# Patient Record
Sex: Female | Born: 2000 | Race: Black or African American | Hispanic: No | Marital: Single | Smoking: Never smoker
Health system: Southern US, Community
[De-identification: ages and names within clinical notes are randomized; demographics above are authoritative.]

## PROBLEM LIST (undated history)

## (undated) DIAGNOSIS — T7840XA Allergy, unspecified, initial encounter: Secondary | ICD-10-CM

## (undated) DIAGNOSIS — J45909 Unspecified asthma, uncomplicated: Secondary | ICD-10-CM

## (undated) DIAGNOSIS — F32A Depression, unspecified: Secondary | ICD-10-CM

## (undated) DIAGNOSIS — F419 Anxiety disorder, unspecified: Secondary | ICD-10-CM

## (undated) DIAGNOSIS — B9689 Other specified bacterial agents as the cause of diseases classified elsewhere: Secondary | ICD-10-CM

## (undated) DIAGNOSIS — N760 Acute vaginitis: Secondary | ICD-10-CM

## (undated) HISTORY — DX: Allergy, unspecified, initial encounter: T78.40XA

## (undated) HISTORY — DX: Other specified bacterial agents as the cause of diseases classified elsewhere: B96.89

## (undated) HISTORY — PX: THROAT SURGERY: SHX803

## (undated) HISTORY — DX: Depression, unspecified: F32.A

## (undated) HISTORY — DX: Other specified bacterial agents as the cause of diseases classified elsewhere: N76.0

## (undated) HISTORY — DX: Anxiety disorder, unspecified: F41.9

---
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
## 2018-07-16 DIAGNOSIS — Z0000 Encounter for general adult medical examination without abnormal findings: Secondary | ICD-10-CM | POA: Insufficient documentation

## 2018-07-16 DIAGNOSIS — R42 Dizziness and giddiness: Secondary | ICD-10-CM | POA: Insufficient documentation

## 2018-12-19 ENCOUNTER — Other Ambulatory Visit: Payer: Self-pay | Admitting: *Deleted

## 2018-12-19 DIAGNOSIS — Z20822 Contact with and (suspected) exposure to covid-19: Secondary | ICD-10-CM

## 2018-12-25 LAB — NOVEL CORONAVIRUS, NAA: SARS-CoV-2, NAA: NOT DETECTED

## 2018-12-31 ENCOUNTER — Telehealth: Payer: Self-pay | Admitting: General Practice

## 2018-12-31 NOTE — Telephone Encounter (Signed)
Informed pt of negative covid result   °

## 2019-07-17 ENCOUNTER — Other Ambulatory Visit: Payer: Self-pay

## 2019-07-17 ENCOUNTER — Emergency Department
Admission: EM | Admit: 2019-07-17 | Discharge: 2019-07-17 | Disposition: A | Discharge status: Home / Self Care | Payer: BC Managed Care – PPO | Attending: Emergency Medicine | Admitting: Emergency Medicine

## 2019-07-17 ENCOUNTER — Emergency Department: Payer: BC Managed Care – PPO

## 2019-07-17 ENCOUNTER — Encounter: Payer: Self-pay | Admitting: Emergency Medicine

## 2019-07-17 DIAGNOSIS — Z79899 Other long term (current) drug therapy: Secondary | ICD-10-CM | POA: Insufficient documentation

## 2019-07-17 DIAGNOSIS — J45909 Unspecified asthma, uncomplicated: Secondary | ICD-10-CM | POA: Insufficient documentation

## 2019-07-17 DIAGNOSIS — M5412 Radiculopathy, cervical region: Secondary | ICD-10-CM

## 2019-07-17 DIAGNOSIS — H6121 Impacted cerumen, right ear: Secondary | ICD-10-CM | POA: Insufficient documentation

## 2019-07-17 MED ORDER — DOCUSATE SODIUM 50 MG/5ML PO LIQD
50.0000 mg | Freq: Once | ORAL | Status: AC
  Administered 2019-07-17: 50 mg via ORAL
  Filled 2019-07-17: qty 10

## 2019-07-17 NOTE — ED Provider Notes (Signed)
[facility] Emergency Department Provider Note  ____________________________________________  Time seen: Approximately 5:33 AM  I have reviewed the triage vital signs and the nursing notes.   HISTORY  Chief Complaint Neck Pain and Otalgia   HPI Sandra Dennis is a 19 y.o. female who presents for evaluation of ear pain and neck pain.  Patient reports that she is here today because she has been dealing with a week of right ear pain.  She has been trying to clean her ear with qtips but that is not happening.  She reports that the pain is constant dull and sharp, moderate.  She is also complaining of neck pain.  She reports that she works in a [employer].  She has had 24 hours of intermittent neck pain that she describes as pain in the center of her neck radiating down to both her arms.  The pain is sharp and intermittent.  Today while at work, she had an episode of the pain that was associated with numbness of the left arm.  She was sent home and therefore decided to come here for evaluation.  She has no pain at this time.  The pain is worse with extension at the shoulder joint.  She denies any trauma.   Past Medical History:  Diagnosis Date  . Asthma     There are no problems to display for this patient.   History reviewed. No pertinent surgical history.  Prior to Admission medications   Medication Sig Start Date End Date Taking? Authorizing Provider  albuterol (PROVENTIL HFA;VENTOLIN HFA) 108 (90 Base) MCG/ACT inhaler Inhale 2 puffs into the lungs every 6 (six) hours as needed for wheezing or shortness of breath. 03/17/17   Caryn Section Linden Dolin, PA-C  azithromycin (ZITHROMAX) 250 MG tablet 2 pills today then 1 pill a day for 4 days 03/17/17   Caryn Section Linden Dolin, PA-C  methylPREDNISolone (MEDROL DOSEPAK) 4 MG TBPK tablet Take 6 pills on day one then decrease by 1 pill each day 03/17/17   Versie Starks, PA-C    Allergies Pollen extract  No  family history on file.  Social History Social History   Tobacco Use  . Smoking status: Never Smoker  . Smokeless tobacco: Never Used  Substance Use Topics  . Alcohol use: No  . Drug use: Not on file    Review of Systems  Constitutional: Negative for fever. Eyes: Negative for visual changes. ENT: Negative for sore throat. + R ear pain Neck: + neck pain  Cardiovascular: Negative for chest pain. Respiratory: Negative for shortness of breath. Gastrointestinal: Negative for abdominal pain, vomiting or diarrhea. Genitourinary: Negative for dysuria. Musculoskeletal: Negative for back pain. Skin: Negative for rash. Neurological: Negative for headaches, weakness or numbness. Psych: No SI or HI  ____________________________________________   PHYSICAL EXAM:  VITAL SIGNS: ED Triage Vitals  Enc Vitals Group     BP 07/17/19 0352 122/71     Pulse Rate 07/17/19 0352 70     Resp 07/17/19 0352 18     Temp 07/17/19 0352 97.7 F (36.5 C)     Temp Source 07/17/19 0352 Oral     SpO2 07/17/19 0352 100 %     Weight 07/17/19 0346 150 lb (68 kg)     Height 07/17/19 0346 5\' 8"  (1.727 m)     Head Circumference --      Peak Flow --      Pain Score 07/17/19 0346 7  Pain Loc --      Pain Edu? --      Excl. in GC? --     Constitutional: Alert and oriented. Well appearing and in no apparent distress. HEENT:      Head: Normocephalic and atraumatic.         Eyes: Conjunctivae are normal. Sclera is non-icteric.       Ears: Cerumen impaction on the R ear      Mouth/Throat: Mucous membranes are moist.       Neck: Supple with no signs of meningismus. No midline cspine tenderness Cardiovascular: Regular rate and rhythm. Respiratory: Normal respiratory effort.  Musculoskeletal: Full painless ROM of the neck. Extension of the shoulder joint bilaterally elicits the pain. Normal distal pulses and brisk capillary refill.  Neurologic: Intact strength and sensation of b/l arms. Skin: Skin is  warm, dry and intact. No rash noted. Psychiatric: Mood and affect are normal. Speech and behavior are normal.  ____________________________________________   LABS (all labs ordered are listed, but only abnormal results are displayed)  Labs Reviewed - No data to display ____________________________________________  EKG  none  ____________________________________________  RADIOLOGY  I have personally reviewed the images performed during this visit and I agree with the Radiologist's read.   Interpretation by Radiologist:  DG Cervical Spine Complete  Result Date: 07/17/2019 CLINICAL DATA:  Neck pain. Right ear pain radiating into the neck since yesterday EXAM: CERVICAL SPINE - COMPLETE 4+ VIEW COMPARISON:  None. FINDINGS: There is no evidence of cervical spine fracture or prevertebral soft tissue swelling. Alignment is normal. No other significant bone abnormalities are identified. IMPRESSION: Negative cervical spine radiographs. Electronically Signed   By: Marnee Spring M.D.   On: 07/17/2019 05:02     ____________________________________________   PROCEDURES  Procedure(s) performed:yes .Ear Cerumen Removal  Date/Time: 07/17/2019 5:33 AM Performed by: Nita Sickle, MD Authorized by: Nita Sickle, MD   Consent:    Consent obtained:  Verbal   Consent given by:  Patient   Risks discussed:  Bleeding, infection, pain, TM perforation, incomplete removal and dizziness   Alternatives discussed:  Alternative treatment Procedure details:    Location:  R ear   Procedure type: irrigation   Post-procedure details:    Inspection:  TM intact   Hearing quality:  Improved   Patient tolerance of procedure:  Tolerated well, no immediate complications   Critical Care performed:  None ____________________________________________   INITIAL IMPRESSION / ASSESSMENT AND PLAN / ED COURSE   19 y.o. female who presents for evaluation of ear pain and neck pain.   # ear pain:  Cerumen impaction removed per procedure note above.  Patient tolerated well with no complications.  TM visualized and intact after procedure.   # neck pain: Seems radicular in nature vs MSK from work line. Neuro intact, vascular intact, no cspine tenderness. XR with no compression fracture or other abnormalities. Recommended tylenol or ibuprofen and avoiding heavy things for a couple of days. If symptoms persist or new symptoms arise, follow up with PCP or return to the ER for further evaluation.        As part of my medical decision making, I reviewed the following data within the electronic MEDICAL RECORD NUMBER Nursing notes reviewed and incorporated, Old chart reviewed, Radiograph reviewed , Notes from prior ED visits and  Controlled Substance Database   Please note:  Patient was evaluated in Emergency Department today for the symptoms described in the history of present illness. Patient was evaluated in  the context of the global COVID-19 pandemic, which necessitated consideration that the patient might be at risk for infection with the SARS-CoV-2 virus that causes COVID-19. Institutional protocols and algorithms that pertain to the evaluation of patients at risk for COVID-19 are in a state of rapid change based on information released by regulatory bodies including the CDC and federal and state organizations. These policies and algorithms were followed during the patient's care in the ED.  Some ED evaluations and interventions may be delayed as a result of limited staffing during the pandemic.   ____________________________________________   FINAL CLINICAL IMPRESSION(S) / ED DIAGNOSES   Final diagnoses:  Cervical radiculopathy  Impacted cerumen of right ear      NEW MEDICATIONS STARTED DURING THIS VISIT:  ED Discharge Orders    None       Note:  This document was prepared using Dragon voice recognition software and may include unintentional dictation errors.    Don Perking,  [place], MD 07/17/19 [phone]

## 2019-07-17 NOTE — ED Triage Notes (Signed)
Patient ambulatory to triage with steady gait, without difficulty or distress noted, mask in place; pt reports rt ear pain radiating into neck since yesterday; denies any recent illness

## 2020-01-20 ENCOUNTER — Ambulatory Visit
Admission: RE | Admit: 2020-01-20 | Discharge: 2020-01-20 | Disposition: A | Discharge status: Home / Self Care | Payer: 59 | Source: Ambulatory Visit | Attending: Emergency Medicine | Admitting: Emergency Medicine

## 2020-01-20 ENCOUNTER — Other Ambulatory Visit: Payer: Self-pay

## 2020-01-20 VITALS — BP 100/66 | HR 117 | Temp 99.7°F | Resp 16

## 2020-01-20 DIAGNOSIS — J029 Acute pharyngitis, unspecified: Secondary | ICD-10-CM | POA: Diagnosis present

## 2020-01-20 LAB — POCT RAPID STREP A (OFFICE): Rapid Strep A Screen: NEGATIVE

## 2020-01-20 NOTE — ED Provider Notes (Signed)
Sandra Dennis    CSN: [id number] Arrival date & time: 01/20/20  0850      History   Chief Complaint Chief Complaint  Patient presents with   Sore Throat    HPI Sandra Dennis is a 19 y.o. female.   Patient presents with 1 day history of sore throat.  She also reports chills and mild facial swelling.  She denies fever, earache, cough, shortness of breath, abdominal pain, vomiting, diarrhea, rash, or other symptoms.  Treatment attempted at home with salt water gargles.  The history is provided by the patient.    Past Medical History:  Diagnosis Date   Asthma     There are no problems to display for this patient.   History reviewed. No pertinent surgical history.  OB History   No obstetric history on file.      Home Medications    Prior to Admission medications   Medication Sig Start Date End Date Taking? Authorizing Provider  albuterol (PROVENTIL HFA;VENTOLIN HFA) 108 (90 Base) MCG/ACT inhaler Inhale 2 puffs into the lungs every 6 (six) hours as needed for wheezing or shortness of breath. 03/17/17   Sherrie Mustache Roselyn Bering, PA-C  azithromycin (ZITHROMAX) 250 MG tablet 2 pills today then 1 pill a day for 4 days 03/17/17   Sherrie Mustache Roselyn Bering, PA-C  methylPREDNISolone (MEDROL DOSEPAK) 4 MG TBPK tablet Take 6 pills on day one then decrease by 1 pill each day 03/17/17   Faythe Ghee, PA-C    Family History History reviewed. No pertinent family history.  Social History Social History   Tobacco Use   Smoking status: Never Smoker   Smokeless tobacco: Never Used  Building services engineer Use: Never used  Substance Use Topics   Alcohol use: No   Drug use: Not on file     Allergies   Pollen extract   Review of Systems Review of Systems  Constitutional: Positive for chills. Negative for fever.  HENT: Positive for facial swelling and sore throat. Negative for ear pain.   Eyes: Negative for pain and visual disturbance.  Respiratory: Negative for cough and  shortness of breath.   Cardiovascular: Negative for chest pain and palpitations.  Gastrointestinal: Negative for abdominal pain, diarrhea, nausea and vomiting.  Genitourinary: Negative for dysuria and hematuria.  Musculoskeletal: Negative for arthralgias and back pain.  Skin: Negative for color change and rash.  Neurological: Negative for seizures and syncope.  All other systems reviewed and are negative.    Physical Exam Triage Vital Signs ED Triage Vitals  Enc Vitals Group     BP 01/20/20 0858 100/66     Pulse Rate 01/20/20 0858 (!) 117     Resp 01/20/20 0858 16     Temp 01/20/20 0858 99.7 F (37.6 C)     Temp Source 01/20/20 0858 Oral     SpO2 01/20/20 0858 96 %     Weight --      Height --      Head Circumference --      Peak Flow --      Pain Score 01/20/20 0900 0     Pain Loc --      Pain Edu? --      Excl. in GC? --    No data found.  Updated Vital Signs BP 100/66 (BP Location: Left Arm)    Pulse (!) 117    Temp 99.7 F (37.6 C) (Oral)    Resp 16    LMP  01/12/2020 (Approximate)    SpO2 96%   Visual Acuity Right Eye Distance:   Left Eye Distance:   Bilateral Distance:    Right Eye Near:   Left Eye Near:    Bilateral Near:     Physical Exam Vitals and nursing note reviewed.  Constitutional:      General: She is not in acute distress.    Appearance: She is well-developed. She is not ill-appearing.  HENT:     Head: Normocephalic and atraumatic.     Right Ear: Tympanic membrane normal.     Left Ear: Tympanic membrane normal.     Nose: Nose normal.     Mouth/Throat:     Mouth: Mucous membranes are moist.     Pharynx: Uvula midline. Posterior oropharyngeal erythema present. No uvula swelling.     Tonsils: 0 on the right. 0 on the left.     Comments: No facial swelling noted.  Eyes:     Conjunctiva/sclera: Conjunctivae normal.  Cardiovascular:     Rate and Rhythm: Normal rate and regular rhythm.     Heart sounds: No murmur heard.   Pulmonary:      Effort: Pulmonary effort is normal. No respiratory distress.     Breath sounds: Normal breath sounds.  Abdominal:     Palpations: Abdomen is soft.     Tenderness: There is no abdominal tenderness. There is no guarding or rebound.  Musculoskeletal:     Cervical back: Neck supple.  Skin:    General: Skin is warm and dry.     Findings: No rash.  Neurological:     General: No focal deficit present.     Mental Status: She is alert and oriented to person, place, and time.     Gait: Gait normal.  Psychiatric:        Mood and Affect: Mood normal.        Behavior: Behavior normal.      UC Treatments / Results  Labs (all labs ordered are listed, but only abnormal results are displayed) Labs Reviewed  CULTURE, GROUP A STREP ([facility])  NOVEL CORONAVIRUS, NAA  POCT RAPID STREP A (OFFICE)    EKG   Radiology No results found.  Procedures Procedures (including critical care time)  Medications Ordered in UC Medications - No data to display  Initial Impression / Assessment and Plan / UC Course  I have reviewed the triage vital signs and the nursing notes.  Pertinent labs & imaging results that were available during my care of the patient were reviewed by me and considered in my medical decision making (see chart for details).   Sore throat.  Rapid strep negative; throat culture pending.  COVID test performed here.  Instructed patient to self quarantine until the test result is back.  Discussed with patient that she can take Tylenol as needed for fever or discomfort.  Instructed patient to go to the emergency department if she develops high fever, shortness of breath, severe diarrhea, or other concerning symptoms.  Patient agrees with plan of care.    Final Clinical Impressions(s) / UC Diagnoses   Final diagnoses:  Sore throat     Discharge Instructions     Your rapid strep test is negative.  A throat culture is pending; we will call you if it is positive requiring treatment.     Your COVID test is pending.  You should self quarantine until the test result is back.    Take Tylenol as needed for fever or  discomfort.  Rest and keep yourself hydrated.    Go to the emergency department if you develop acute worsening symptoms.        ED Prescriptions    None     PDMP not reviewed this encounter.   Mickie Bail, NP 01/20/20 [phone]

## 2020-01-20 NOTE — ED Triage Notes (Signed)
Pt presents to UC for left sided sore throat and facial swelling x2 days. Pt denies cough, congestion, fever, chills, body aches, HA.   Pt denies OTC treatments or relieving factors.

## 2020-01-20 NOTE — Discharge Instructions (Addendum)
Your rapid strep test is negative.  A throat culture is pending; we will call you if it is positive requiring treatment.    Your COVID test is pending.  You should self quarantine until the test result is back.    Take Tylenol as needed for fever or discomfort.  Rest and keep yourself hydrated.    Go to the emergency department if you develop acute worsening symptoms.     

## 2020-01-22 LAB — NOVEL CORONAVIRUS, NAA: SARS-CoV-2, NAA: NOT DETECTED

## 2020-01-22 LAB — SARS-COV-2, NAA 2 DAY TAT

## 2020-01-24 ENCOUNTER — Observation Stay
Admission: EM | Admit: 2020-01-24 | Discharge: 2020-01-25 | Disposition: A | Discharge status: Home / Self Care | Payer: 59 | Attending: Otolaryngology | Admitting: Otolaryngology

## 2020-01-24 ENCOUNTER — Encounter: Payer: Self-pay | Admitting: Emergency Medicine

## 2020-01-24 ENCOUNTER — Encounter
Admission: EM | Disposition: A | Discharge status: Home / Self Care | Payer: Self-pay | Source: Home / Self Care | Attending: Student in an Organized Health Care Education/Training Program

## 2020-01-24 ENCOUNTER — Emergency Department: Payer: 59

## 2020-01-24 ENCOUNTER — Other Ambulatory Visit: Payer: Self-pay

## 2020-01-24 ENCOUNTER — Emergency Department: Payer: 59 | Admitting: Certified Registered"

## 2020-01-24 DIAGNOSIS — R000 Tachycardia, unspecified: Secondary | ICD-10-CM | POA: Diagnosis not present

## 2020-01-24 DIAGNOSIS — J45909 Unspecified asthma, uncomplicated: Secondary | ICD-10-CM | POA: Insufficient documentation

## 2020-01-24 DIAGNOSIS — J36 Peritonsillar abscess: Principal | ICD-10-CM | POA: Diagnosis present

## 2020-01-24 DIAGNOSIS — R590 Localized enlarged lymph nodes: Secondary | ICD-10-CM | POA: Diagnosis not present

## 2020-01-24 DIAGNOSIS — Z20822 Contact with and (suspected) exposure to covid-19: Secondary | ICD-10-CM | POA: Insufficient documentation

## 2020-01-24 DIAGNOSIS — J029 Acute pharyngitis, unspecified: Secondary | ICD-10-CM | POA: Diagnosis present

## 2020-01-24 HISTORY — PX: INCISION AND DRAINAGE OF PERITONSILLAR ABCESS: SHX6257

## 2020-01-24 LAB — URINALYSIS, COMPLETE (UACMP) WITH MICROSCOPIC
Bacteria, UA: NONE SEEN
Bilirubin Urine: NEGATIVE
Glucose, UA: NEGATIVE mg/dL
Hgb urine dipstick: NEGATIVE
Ketones, ur: 80 mg/dL — AB
Leukocytes,Ua: NEGATIVE
Nitrite: NEGATIVE
Protein, ur: 30 mg/dL — AB
Specific Gravity, Urine: 1.029 (ref 1.005–1.030)
pH: 5 (ref 5.0–8.0)

## 2020-01-24 LAB — CULTURE, GROUP A STREP (THRC)

## 2020-01-24 LAB — COMPREHENSIVE METABOLIC PANEL
ALT: 13 U/L (ref 0–44)
AST: 15 U/L (ref 15–41)
Albumin: 4.2 g/dL (ref 3.5–5.0)
Alkaline Phosphatase: 60 U/L (ref 38–126)
Anion gap: 14 (ref 5–15)
BUN: 15 mg/dL (ref 6–20)
CO2: 23 mmol/L (ref 22–32)
Calcium: 9.6 mg/dL (ref 8.9–10.3)
Chloride: 99 mmol/L (ref 98–111)
Creatinine, Ser: 0.84 mg/dL (ref 0.44–1.00)
GFR calc Af Amer: >60 mL/min (ref >60)
GFR calc non Af Amer: >60 mL/min (ref >60)
Glucose, Bld: 103 mg/dL — ABNORMAL HIGH (ref 70–99)
Potassium: 3.8 mmol/L (ref 3.5–5.1)
Sodium: 136 mmol/L (ref 135–145)
Total Bilirubin: 2.2 mg/dL — ABNORMAL HIGH (ref 0.3–1.2)
Total Protein: 8.7 g/dL — ABNORMAL HIGH (ref 6.5–8.1)

## 2020-01-24 LAB — CBC WITH DIFFERENTIAL/PLATELET
Abs Immature Granulocytes: 0.05 10*3/uL (ref 0.00–0.07)
Basophils Absolute: 0 10*3/uL (ref 0.0–0.1)
Basophils Relative: 0 %
Eosinophils Absolute: 0.1 10*3/uL (ref 0.0–0.5)
Eosinophils Relative: 0 %
HCT: 39.9 % (ref 36.0–46.0)
Hemoglobin: 13.8 g/dL (ref 12.0–15.0)
Immature Granulocytes: 0 %
Lymphocytes Relative: 9 %
Lymphs Abs: 1.3 10*3/uL (ref 0.7–4.0)
MCH: 29.7 pg (ref 26.0–34.0)
MCHC: 34.6 g/dL (ref 30.0–36.0)
MCV: 86 fL (ref 80.0–100.0)
Monocytes Absolute: 1.5 10*3/uL — ABNORMAL HIGH (ref 0.1–1.0)
Monocytes Relative: 11 %
Neutro Abs: 11.2 10*3/uL — ABNORMAL HIGH (ref 1.7–7.7)
Neutrophils Relative %: 80 %
Platelets: 277 10*3/uL (ref 150–400)
RBC: 4.64 MIL/uL (ref 3.87–5.11)
RDW: 11.5 % (ref 11.5–15.5)
WBC: 14.2 10*3/uL — ABNORMAL HIGH (ref 4.0–10.5)
nRBC: 0 % (ref 0.0–0.2)

## 2020-01-24 LAB — POCT PREGNANCY, URINE: Preg Test, Ur: NEGATIVE

## 2020-01-24 LAB — MONONUCLEOSIS SCREEN: Mono Screen: NEGATIVE

## 2020-01-24 LAB — LACTIC ACID, PLASMA: Lactic Acid, Venous: 1.3 mmol/L (ref 0.5–1.9)

## 2020-01-24 LAB — POC SARS CORONAVIRUS 2 AG: SARS Coronavirus 2 Ag: NEGATIVE

## 2020-01-24 SURGERY — INCISION AND DRAINAGE, ABSCESS, PERITONSILLAR
Anesthesia: General

## 2020-01-24 MED ORDER — KETOROLAC TROMETHAMINE 30 MG/ML IJ SOLN
INTRAMUSCULAR | Status: DC | PRN
  Administered 2020-01-24: 30 mg via INTRAVENOUS

## 2020-01-24 MED ORDER — DEXAMETHASONE SODIUM PHOSPHATE 10 MG/ML IJ SOLN
10.0000 mg | Freq: Once | INTRAMUSCULAR | Status: AC
  Administered 2020-01-24: 10 mg via INTRAVENOUS
  Filled 2020-01-24: qty 1

## 2020-01-24 MED ORDER — SILVER NITRATE-POT NITRATE 75-25 % EX MISC
CUTANEOUS | Status: AC
  Filled 2020-01-24: qty 10

## 2020-01-24 MED ORDER — ONDANSETRON HCL 4 MG/2ML IJ SOLN: 4.0000 mg | Freq: Once | INTRAMUSCULAR | Status: DC | PRN

## 2020-01-24 MED ORDER — ONDANSETRON HCL 4 MG/2ML IJ SOLN
INTRAMUSCULAR | Status: AC
  Filled 2020-01-24: qty 2

## 2020-01-24 MED ORDER — ACETAMINOPHEN 10 MG/ML IV SOLN
INTRAVENOUS | Status: DC | PRN
  Administered 2020-01-24: 1000 mg via INTRAVENOUS

## 2020-01-24 MED ORDER — MIDAZOLAM HCL 2 MG/2ML IJ SOLN
INTRAMUSCULAR | Status: DC | PRN
  Administered 2020-01-24: 2 mg via INTRAVENOUS

## 2020-01-24 MED ORDER — PROPOFOL 10 MG/ML IV BOLUS
INTRAVENOUS | Status: DC | PRN
  Administered 2020-01-24: 140 mg via INTRAVENOUS

## 2020-01-24 MED ORDER — ONDANSETRON HCL 4 MG/2ML IJ SOLN
INTRAMUSCULAR | Status: DC | PRN
  Administered 2020-01-24: 4 mg via INTRAVENOUS

## 2020-01-24 MED ORDER — ONDANSETRON HCL 4 MG/2ML IJ SOLN
4.0000 mg | Freq: Once | INTRAMUSCULAR | Status: AC
  Administered 2020-01-24: 4 mg via INTRAVENOUS
  Filled 2020-01-24: qty 2

## 2020-01-24 MED ORDER — HYDROCODONE-ACETAMINOPHEN 5-325 MG PO TABS
1.0000 | ORAL_TABLET | ORAL | Status: DC | PRN
  Administered 2020-01-25: 1 via ORAL
  Filled 2020-01-24: qty 1

## 2020-01-24 MED ORDER — ACETAMINOPHEN 10 MG/ML IV SOLN
INTRAVENOUS | Status: AC
  Filled 2020-01-24: qty 100

## 2020-01-24 MED ORDER — FENTANYL CITRATE (PF) 100 MCG/2ML IJ SOLN
INTRAMUSCULAR | Status: AC
  Filled 2020-01-24: qty 2

## 2020-01-24 MED ORDER — SODIUM CHLORIDE 0.9 % IV SOLN
3.0000 g | Freq: Once | INTRAVENOUS | Status: AC
  Administered 2020-01-24: 3 g via INTRAVENOUS
  Filled 2020-01-24: qty 8

## 2020-01-24 MED ORDER — PROPOFOL 10 MG/ML IV BOLUS
INTRAVENOUS | Status: AC
  Filled 2020-01-24: qty 20

## 2020-01-24 MED ORDER — ONDANSETRON HCL 4 MG/2ML IJ SOLN: 4.0000 mg | Freq: Four times a day (QID) | INTRAMUSCULAR | Status: DC | PRN

## 2020-01-24 MED ORDER — FENTANYL CITRATE (PF) 100 MCG/2ML IJ SOLN: 25.0000 ug | INTRAMUSCULAR | Status: DC | PRN

## 2020-01-24 MED ORDER — MORPHINE SULFATE (PF) 2 MG/ML IV SOLN
2.0000 mg | Freq: Once | INTRAVENOUS | Status: AC
  Administered 2020-01-24: 2 mg via INTRAVENOUS
  Filled 2020-01-24: qty 1

## 2020-01-24 MED ORDER — MIDAZOLAM HCL 2 MG/2ML IJ SOLN
INTRAMUSCULAR | Status: AC
  Filled 2020-01-24: qty 2

## 2020-01-24 MED ORDER — ACETAMINOPHEN 500 MG PO TABS
1000.0000 mg | ORAL_TABLET | Freq: Once | ORAL | Status: AC
  Administered 2020-01-24: 1000 mg via ORAL
  Filled 2020-01-24: qty 2

## 2020-01-24 MED ORDER — KETOROLAC TROMETHAMINE 30 MG/ML IJ SOLN
INTRAMUSCULAR | Status: AC
  Filled 2020-01-24: qty 1

## 2020-01-24 MED ORDER — LACTATED RINGERS IV SOLN: INTRAVENOUS | Status: DC | PRN

## 2020-01-24 MED ORDER — MORPHINE SULFATE (PF) 4 MG/ML IV SOLN: 3.0000 mg | INTRAVENOUS | Status: DC | PRN

## 2020-01-24 MED ORDER — SUCCINYLCHOLINE CHLORIDE 20 MG/ML IJ SOLN
INTRAMUSCULAR | Status: DC | PRN
  Administered 2020-01-24 (×2): 100 mg via INTRAVENOUS

## 2020-01-24 MED ORDER — IOHEXOL 300 MG/ML  SOLN
75.0000 mL | Freq: Once | INTRAMUSCULAR | Status: AC | PRN
  Administered 2020-01-24: 75 mL via INTRAVENOUS
  Filled 2020-01-24: qty 75

## 2020-01-24 MED ORDER — MAGNESIUM HYDROXIDE 400 MG/5ML PO SUSP: 30.0000 mL | Freq: Every day | ORAL | Status: DC | PRN

## 2020-01-24 MED ORDER — SUGAMMADEX SODIUM 200 MG/2ML IV SOLN
INTRAVENOUS | Status: DC | PRN
Start: 2020-01-24 — End: 2020-01-24
  Administered 2020-01-24: 100 mg via INTRAVENOUS

## 2020-01-24 MED ORDER — ONDANSETRON 4 MG PO TBDP: 4.0000 mg | ORAL_TABLET | Freq: Four times a day (QID) | ORAL | Status: DC | PRN

## 2020-01-24 MED ORDER — ROCURONIUM BROMIDE 100 MG/10ML IV SOLN
INTRAVENOUS | Status: DC | PRN
  Administered 2020-01-24: 10 mg via INTRAVENOUS

## 2020-01-24 MED ORDER — SODIUM CHLORIDE 0.9 % IV BOLUS
1000.0000 mL | Freq: Once | INTRAVENOUS | Status: AC
  Administered 2020-01-24: 1000 mL via INTRAVENOUS

## 2020-01-24 MED ORDER — DEXAMETHASONE SODIUM PHOSPHATE 10 MG/ML IJ SOLN: 10.0000 mg | Freq: Once | INTRAMUSCULAR | Status: DC

## 2020-01-24 MED ORDER — LIDOCAINE HCL (PF) 2 % IJ SOLN
INTRAMUSCULAR | Status: AC
  Filled 2020-01-24: qty 5

## 2020-01-24 MED ORDER — SODIUM CHLORIDE 0.9 % IV SOLN
3.0000 g | Freq: Four times a day (QID) | INTRAVENOUS | Status: DC
  Administered 2020-01-24 – 2020-01-25 (×2): 3 g via INTRAVENOUS
  Filled 2020-01-24: qty 3
  Filled 2020-01-24 (×3): qty 8
  Filled 2020-01-24: qty 3

## 2020-01-24 MED ORDER — ALBUTEROL SULFATE (2.5 MG/3ML) 0.083% IN NEBU: 2.5000 mg | INHALATION_SOLUTION | Freq: Four times a day (QID) | RESPIRATORY_TRACT | Status: DC | PRN

## 2020-01-24 MED ORDER — LIDOCAINE HCL (CARDIAC) PF 100 MG/5ML IV SOSY
PREFILLED_SYRINGE | INTRAVENOUS | Status: DC | PRN
  Administered 2020-01-24: 100 mg via INTRAVENOUS

## 2020-01-24 MED ORDER — FENTANYL CITRATE (PF) 100 MCG/2ML IJ SOLN
INTRAMUSCULAR | Status: DC | PRN
  Administered 2020-01-24 (×2): 50 ug via INTRAVENOUS

## 2020-01-24 SURGICAL SUPPLY — 9 items
CANISTER SUCT 1200ML W/VALVE (MISCELLANEOUS) ×3 IMPLANT
COAG SUCT 10F 3.5MM HAND CTRL (MISCELLANEOUS) ×3 IMPLANT
COVER WAND RF STERILE (DRAPES) ×3 IMPLANT
ELECT REM PT RETURN 9FT ADLT (ELECTROSURGICAL) ×3
ELECTRODE REM PT RTRN 9FT ADLT (ELECTROSURGICAL) ×1 IMPLANT
GLOVE PROTEXIS LATEX SZ 7.5 (GLOVE) ×3 IMPLANT
GOWN STRL REUS W/ TWL LRG LVL3 (GOWN DISPOSABLE) ×1 IMPLANT
GOWN STRL REUS W/TWL LRG LVL3 (GOWN DISPOSABLE) ×2
PACK HEAD/NECK (MISCELLANEOUS) ×3 IMPLANT

## 2020-01-24 NOTE — ED Notes (Signed)
Pt transported to OR via stretcher by ED tech

## 2020-01-24 NOTE — Progress Notes (Signed)
Patient COVID negative.

## 2020-01-24 NOTE — Op Note (Signed)
01/24/2020  8:42 PM    Sandra Dennis  [id number]   Pre-Op Dx: Left peritonsillar abscess  Post-op Dx: Same  Proc: I&D left peritonsillar abscess  Surg:  Sandra Dennis Sandra Dennis  Anes:  GOT  EBL: 20 mL  Comp: None  Findings: Tense left peritonsillar area with large abscess.  Culture taken.  Purulence was draining through the tonsil prior to draining   Procedure: The patient was brought to the operating room and placed in the supine position.  She was given general anesthesia by oral endotracheal intubation.  There is no swelling at her epiglottis or larynx.  The left tonsil was pushed towards the midline with purulence coming from a crease in the middle of the tonsil.  There was pointing along the anterior tonsillar pillar.  The right tonsil was not very swollen and the posterior pharyngeal wall was smooth and not swollen either.  A Vernelle Emerald was used to hold the mouth open to visualize this area.  The nasopharynx was clear.  The tonsil was as noted above.  An incision was created in the left anterior tonsillar pillar where it was pointing.  Incision was approximately 15 mm long.  Pus immediately came rolling out and this was cultured and sent for aerobic and anaerobic bacteria.  A hemostat was used to widen this area some and suction was used to completely clear the left peritonsillar area.  There was mild oozing but no significant bleeding from the abscess area.  This was flushed with saline to make sure it was all cleared out.  The patient tolerated the procedure well.  She was awakened taken to the recovery room in satisfactory condition.  The wound was left open to heal by secondary intention.  Dispo:   To PACU to be then sent to the floor for observation overnight.  Plan: We will continue IV antibiotics through the night and give a dose of IV Decadron in the morning.  If she is tolerating liquids well and can swallow pills then she can be discharged home in the morning.  If she  still is not tolerating orals well then she will need to stay possibly another 24 hours to make sure that she will not get dehydrated at home and that she can take her medications at home.  Sandra Dennis Sandra Dennis  01/24/2020 8:42 PM

## 2020-01-24 NOTE — Anesthesia Preprocedure Evaluation (Signed)
Anesthesia Evaluation  Patient identified by MRN, date of birth, ID band Patient awake    Reviewed: Allergy & Precautions, H&P , NPO status , Patient's Chart, lab work & pertinent test results, reviewed documented beta blocker date and time   Airway Mallampati: II  TM Distance: >3 FB Neck ROM: full    Dental  (+) Teeth Intact   Pulmonary asthma ,    Pulmonary exam normal        Cardiovascular Exercise Tolerance: Good negative cardio ROS Normal cardiovascular exam Rhythm:regular Rate:Normal     Neuro/Psych negative neurological ROS  negative psych ROS   GI/Hepatic negative GI ROS, Neg liver ROS,   Endo/Other  negative endocrine ROS  Renal/GU negative Renal ROS  negative genitourinary   Musculoskeletal   Abdominal   Peds  Hematology negative hematology ROS (+)   Anesthesia Other Findings Past Medical History: No date: Asthma History reviewed. No pertinent surgical history. BMI    Body Mass Index: 18.25 kg/m     Reproductive/Obstetrics negative OB ROS                             Anesthesia Physical Anesthesia Plan  ASA: II  Anesthesia Plan: General ETT   Post-op Pain Management:    Induction:   PONV Risk Score and Plan:   Airway Management Planned:   Additional Equipment:   Intra-op Plan:   Post-operative Plan:   Informed Consent: I have reviewed the patients History and Physical, chart, labs and discussed the procedure including the risks, benefits and alternatives for the proposed anesthesia with the patient or authorized representative who has indicated his/her understanding and acceptance.     Dental Advisory Given  Plan Discussed with: CRNA  Anesthesia Plan Comments:         Anesthesia Quick Evaluation

## 2020-01-24 NOTE — Transfer of Care (Signed)
Immediate Anesthesia Transfer of Care Note  Patient: Sandra Dennis  Procedure(s) Performed: INCISION AND DRAINAGE OF PERITONSILLAR ABCESS (N/A )  Patient Location: PACU  Anesthesia Type:General  Level of Consciousness: awake, alert  and oriented  Airway & Oxygen Therapy: Patient Spontanous Breathing and Patient connected to nasal cannula oxygen  Post-op Assessment: Report given to RN and Post -op Vital signs reviewed and stable  Post vital signs: Reviewed and stable  Last Vitals:  Vitals Value Taken Time  BP 119/82 01/24/20 2048  Temp 36.1 C 01/24/20 2048  Pulse 94 01/24/20 2050  Resp 22 01/24/20 2050  SpO2 100 % 01/24/20 2050  Vitals shown include unvalidated device data.  Last Pain:  Vitals:   01/24/20 2048  TempSrc:   PainSc: 0-No pain         Complications: No complications documented.

## 2020-01-24 NOTE — ED Notes (Signed)
Patient transported to CT 

## 2020-01-24 NOTE — ED Provider Notes (Signed)
[facility] EMERGENCY DEPARTMENT Provider Note   CSN: [id number] Arrival date & time: 01/24/20  1233     History Chief Complaint  Patient presents with  . Sore Throat    Sandra Dennis is a 19 y.o. female presents to the emergency department for evaluation of sore throat x6 days.  Patient seen at urgent care facility on 01/20/2020, had negative Covid and strep test as well as a negative strep culture.  Patient has been taking Tylenol with little relief.  She has had a low-grade fever and describes severe left-sided sore throat pain with left mandibular pain radiating into her ear.  She denies any rashes.  She describes painful trismus.  No headaches, abdominal pain, nausea or vomiting.  No coughing chest pain or shortness of breath.  Pain is moderate.  She is been able to tolerate some fluids and swallowing pills but has not been eating much due to severity of left-sided throat pain.  She does not feel as if her throat is swollen.  HPI     Past Medical History:  Diagnosis Date  . Asthma     There are no problems to display for this patient.   History reviewed. No pertinent surgical history.   OB History   No obstetric history on file.     History reviewed. No pertinent family history.  Social History   Tobacco Use  . Smoking status: Never Smoker  . Smokeless tobacco: Never Used  Vaping Use  . Vaping Use: Never used  Substance Use Topics  . Alcohol use: No  . Drug use: Not on file    Home Medications Prior to Admission medications   Medication Sig Start Date End Date Taking? Authorizing Provider  albuterol (PROVENTIL HFA;VENTOLIN HFA) 108 (90 Base) MCG/ACT inhaler Inhale 2 puffs into the lungs every 6 (six) hours as needed for wheezing or shortness of breath. 03/17/17   Sherrie Mustache Roselyn Bering, PA-C  azithromycin (ZITHROMAX) 250 MG tablet 2 pills today then 1 pill a day for 4 days 03/17/17   Sherrie Mustache Roselyn Bering, PA-C  methylPREDNISolone (MEDROL DOSEPAK) 4  MG TBPK tablet Take 6 pills on day one then decrease by 1 pill each day 03/17/17   Faythe Ghee, PA-C    Allergies    Pollen extract  Review of Systems   Review of Systems  Constitutional: Positive for fever. Negative for chills.  HENT: Positive for sore throat, trouble swallowing and voice change. Negative for congestion, ear discharge, facial swelling, rhinorrhea, sinus pressure and sinus pain.   Respiratory: Negative for cough, choking, chest tightness, shortness of breath, wheezing and stridor.   Cardiovascular: Negative for chest pain.  Gastrointestinal: Negative for abdominal pain, diarrhea, nausea and vomiting.  Genitourinary: Negative for dysuria, flank pain and pelvic pain.  Musculoskeletal: Negative for back pain, myalgias and neck stiffness.  Skin: Negative for rash.  Neurological: Negative for dizziness, weakness and headaches.    Physical Exam Updated Vital Signs BP 115/77 (BP Location: Right Arm)   Pulse 99   Temp (!) 100.4 F (38 C) (Oral)   Resp 16   Ht 5\' 8"  (1.727 m)   Wt 54.4 kg   LMP 01/12/2020 (Approximate) Comment: neg preg test today  SpO2 97%   BMI 18.25 kg/m   Physical Exam Vitals reviewed.  Constitutional:      General: She is not in acute distress.    Appearance: Normal appearance. She is well-developed and normal weight.  HENT:  Head: Normocephalic and atraumatic.     Jaw: No trismus.     Comments: Left submandibular region with mild soft tissue swelling and tenderness to palpation.  Slight anterior and posterior cervical lymphadenopathy present more on the left side than right.  Trismus is noted, unable to visualize pharynx completely, does appear to be some left-sided peritonsillar soft tissue swelling but unable to exhibit any exudative tonsillitis.  Due to pain and trismus pharyngeal exam limited.  No noticeable anterior neck swelling on exam.  No noted facial swelling on exam.    Right Ear: Hearing, tympanic membrane, ear canal and  external ear normal.     Left Ear: Hearing, tympanic membrane, ear canal and external ear normal.     Nose: No rhinorrhea.     Mouth/Throat:     Pharynx: Posterior oropharyngeal erythema present. No oropharyngeal exudate or uvula swelling.     Tonsils: No tonsillar exudate or tonsillar abscesses.  Eyes:     General:        Right eye: No discharge.        Left eye: No discharge.     Conjunctiva/sclera: Conjunctivae normal.  Cardiovascular:     Rate and Rhythm: Regular rhythm. Tachycardia present.     Pulses: Normal pulses.     Heart sounds: Normal heart sounds.  Pulmonary:     Effort: Pulmonary effort is normal. No respiratory distress.     Breath sounds: Normal breath sounds. No stridor. No wheezing or rales.  Abdominal:     General: Abdomen is flat. There is no distension.     Palpations: Abdomen is soft.     Tenderness: There is no abdominal tenderness.  Musculoskeletal:        General: No deformity. Normal range of motion.     Cervical back: Normal range of motion and neck supple.  Lymphadenopathy:     Cervical: Cervical adenopathy present.  Skin:    General: Skin is warm and dry.  Neurological:     General: No focal deficit present.     Mental Status: She is alert and oriented to person, place, and time. Mental status is at baseline.     Deep Tendon Reflexes: Reflexes are normal and symmetric.  Psychiatric:        Behavior: Behavior normal.        Thought Content: Thought content normal.     ED Results / Procedures / Treatments   Labs (all labs ordered are listed, but only abnormal results are displayed) Labs Reviewed  COMPREHENSIVE METABOLIC PANEL - Abnormal; Notable for the following components:      Result Value   Glucose, Bld 103 (*)    Total Protein 8.7 (*)    Total Bilirubin 2.2 (*)    All other components within normal limits  CBC WITH DIFFERENTIAL/PLATELET - Abnormal; Notable for the following components:   WBC 14.2 (*)    Neutro Abs 11.2 (*)     Monocytes Absolute 1.5 (*)    All other components within normal limits  URINALYSIS, COMPLETE (UACMP) WITH MICROSCOPIC - Abnormal; Notable for the following components:   Color, Urine AMBER (*)    APPearance HAZY (*)    Ketones, ur 80 (*)    Protein, ur 30 (*)    All other components within normal limits  CULTURE, BLOOD (SINGLE)  LACTIC ACID, PLASMA  MONONUCLEOSIS SCREEN  POC URINE PREG, ED  POCT PREGNANCY, URINE  POC URINE PREG, ED  POC SARS CORONAVIRUS 2 AG -  ED  EKG None  Radiology CT Soft Tissue Neck W Contrast  Result Date: 01/24/2020 CLINICAL DATA:  Neck swelling EXAM: CT NECK WITH CONTRAST TECHNIQUE: Multidetector CT imaging of the neck was performed using the standard protocol following the bolus administration of intravenous contrast. CONTRAST:  75mL OMNIPAQUE IOHEXOL 300 MG/ML  SOLN COMPARISON:  None. FINDINGS: Pharynx and larynx: Within the dorsal left nasopharyngeal wall there is a 1.0 x 0.8 cm peripherally enhancing fluid collection. Anteromedial displacement of the left adenoid and tubal tonsil. The fluid collection tracks inferiorly along the left parapharyngeal space with anterior displacement of the left palatine tonsil. Inferiorly the peripherally enhancing collection measures up to 2.7 x 1.4 cm (3:37) at the level of the lingual tonsil which is medially displaced. The collection measures 4.6 cm in cranial caudal dimension (6:49). Inflammatory stranding within the left parapharyngeal fat. Mild narrowing of the pharyngeal airway. Partial effacement of the left vallecula and piriform sinus. Normal appearance of the hypopharynx. The glottic and subglottic larynx are unremarkable. Salivary glands: No inflammation, mass, or stone. Thyroid: Normal. Lymph nodes: Enlarged left level 1B, bilateral 2A and 2B nodes are reactive. Prominent nodes within the bilateral level 4 and left level 5 regions are also reactive. Vascular: Normal intravascular enhancement. Limited intracranial:  Grossly unremarkable. Visualized orbits: Normal orbits. Mastoids and visualized paranasal sinuses: Mild ethmoid sinus mucosal thickening. Clear mastoid air cells. Skeleton: No acute or suspicious osseous abnormalities. Upper chest: Minimal atelectasis. Other: None. IMPRESSION: Left peritonsillar abscess extending from the level of the nasopharynx to the level of the mandibular angle measuring up to 4.6 x 2.7 x 1.4 cm. Mild narrowing of the pharyngeal airway. Reactive bilateral cervical adenopathy. These results were called by telephone at the time of interpretation on 01/24/2020 at 6:28 pm to provider PA Greig RightSusan Fisher, who verbally acknowledged these results. Electronically Signed   By: Stana Buntinghikanele  Emekauwa M.D.   On: 01/24/2020 18:30    Procedures Procedures (including critical care time)  Medications Ordered in ED Medications  sodium chloride 0.9 % bolus 1,000 mL (1,000 mLs Intravenous New Bag/Given 01/24/20 1643)  Ampicillin-Sulbactam (UNASYN) 3 g in sodium chloride 0.9 % 100 mL IVPB (0 g Intravenous Stopped 01/24/20 1912)  morphine 2 MG/ML injection 2 mg (2 mg Intravenous Given 01/24/20 1647)  dexamethasone (DECADRON) injection 10 mg (10 mg Intravenous Given 01/24/20 1646)  ondansetron (ZOFRAN) injection 4 mg (4 mg Intravenous Given 01/24/20 1645)  acetaminophen (TYLENOL) tablet 1,000 mg (1,000 mg Oral Given 01/24/20 1649)  iohexol (OMNIPAQUE) 300 MG/ML solution 75 mL (75 mLs Intravenous Contrast Given 01/24/20 1730)    ED Course  I have reviewed the triage vital signs and the nursing notes.  Pertinent labs & imaging results that were available during my care of the patient were reviewed by me and considered in my medical decision making (see chart for details).    MDM Rules/Calculators/A&P                          19 year old female with large left peritonsillar abscess.  Symptoms began 6 days ago, 5 days ago seen by urgent care, had negative strep and Covid test.  Patient was told to take  over-the-counter medications.  Today, she comes in for worsening symptoms.  She has significant trismus with voice changes.  She was handling her secretions.  Patient with low-grade fever, normal heart rate and blood pressure.  Mild leukocytosis of 14.2.  CT scan obtained showing large left peritonsillar abscess.  Discussed  with ENT who will come in and evaluate patient in the ER and will likely take to the OR for incision and drainage.  Rapid Covid test ordered.  Discussed exam and imaging findings as well as plan with patient     Final Clinical Impression(s) / ED Diagnoses Final diagnoses:  Pharyngitis, unspecified etiology    Rx / DC Orders ED Discharge Orders    None       Ronnette Juniper 01/24/20 1926    Willy Eddy, MD 01/24/20 1935

## 2020-01-24 NOTE — H&P (Signed)
Sandra Dennis is an 19 y.o. female.   Chief Complaint: Acute left throat pain HPI: The patient is a 19 year old African-American female who is only had 1 previous sore throat in her life.  She started having signs of throat infection a week or so ago.  She was given Zithromax, Medrol Dosepak, and albuterol inhaler.  Her sore throat has worsened and now in the last 2 days she is only been able to drink a very little bit of fluids.  She has not had any solid food in a couple of days.  She is barely can get her mouth open at all.  She has never had an infection like this before.  Past Medical History:  Diagnosis Date  . Asthma     History reviewed. No pertinent surgical history.  History reviewed. No pertinent family history. Social History:  reports that she has never smoked. She has never used smokeless tobacco. She reports that she does not drink alcohol. No history on file for drug use.  Allergies:  Allergies  Allergen Reactions  . Pollen Extract Swelling    Itchy eyes, runny nose Itchy eyes, runny nose     (Not in a hospital admission)   Results for orders placed or performed during the hospital encounter of 01/24/20 (from the past 48 hour(s))  Lactic acid, plasma     Status: None   Collection Time: 01/24/20 12:53 PM  Result Value Ref Range   Lactic Acid, Venous 1.3 0.5 - 1.9 mmol/L    Comment: Performed at [facility], [address] [place], [place] [address]  Comprehensive metabolic panel     Status: Abnormal   Collection Time: 01/24/20 12:53 PM  Result Value Ref Range   Sodium 136 135 - 145 mmol/L   Potassium 3.8 3.5 - 5.1 mmol/L   Chloride 99 98 - 111 mmol/L   CO2 23 22 - 32 mmol/L   Glucose, Bld 103 (H) 70 - 99 mg/dL    Comment: Glucose reference range applies only to samples taken after fasting for at least 8 hours.   BUN 15 6 - 20 mg/dL   Creatinine, Ser 9.56 0.44 - 1.00 mg/dL   Calcium 9.6 8.9 - 38.7 mg/dL   Total Protein 8.7 (H) 6.5 - 8.1 g/dL    Albumin 4.2 3.5 - 5.0 g/dL   AST 15 15 - 41 U/L   ALT 13 0 - 44 U/L   Alkaline Phosphatase 60 38 - 126 U/L   Total Bilirubin 2.2 (H) 0.3 - 1.2 mg/dL   GFR calc non Af Amer >60 >60 mL/min   GFR calc Af Amer >60 >60 mL/min   Anion gap 14 5 - 15    Comment: Performed at [facility], [address] [place], [place] [address]  CBC with Differential     Status: Abnormal   Collection Time: 01/24/20 12:53 PM  Result Value Ref Range   WBC 14.2 (H) 4.0 - 10.5 K/uL   RBC 4.64 3.87 - 5.11 MIL/uL   Hemoglobin 13.8 12.0 - 15.0 g/dL   HCT 29.5 36 - 46 %   MCV 86.0 80.0 - 100.0 fL   MCH 29.7 26.0 - 34.0 pg   MCHC 34.6 30.0 - 36.0 g/dL   RDW 18.8 41.6 - 60.6 %   Platelets 277 150 - 400 K/uL   nRBC 0.0 0.0 - 0.2 %   Neutrophils Relative % 80 %   Neutro Abs 11.2 (H) 1.7 - 7.7 K/uL  Lymphocytes Relative 9 %   Lymphs Abs 1.3 0.7 - 4.0 K/uL   Monocytes Relative 11 %   Monocytes Absolute 1.5 (H) 0 - 1 K/uL   Eosinophils Relative 0 %   Eosinophils Absolute 0.1 0 - 0 K/uL   Basophils Relative 0 %   Basophils Absolute 0.0 0 - 0 K/uL   Immature Granulocytes 0 %   Abs Immature Granulocytes 0.05 0.00 - 0.07 K/uL    Comment: Performed at [facility], [address] [place], [place] [address]  Urinalysis, Complete w Microscopic Urine, Clean Catch     Status: Abnormal   Collection Time: 01/24/20 12:53 PM  Result Value Ref Range   Color, Urine AMBER (A) YELLOW    Comment: BIOCHEMICALS MAY BE AFFECTED BY COLOR   APPearance HAZY (A) CLEAR   Specific Gravity, Urine 1.029 1.005 - 1.030   pH 5.0 5.0 - 8.0   Glucose, UA NEGATIVE NEGATIVE mg/dL   Hgb urine dipstick NEGATIVE NEGATIVE   Bilirubin Urine NEGATIVE NEGATIVE   Ketones, ur 80 (A) NEGATIVE mg/dL   Protein, ur 30 (A) NEGATIVE mg/dL   Nitrite NEGATIVE NEGATIVE   Leukocytes,Ua NEGATIVE NEGATIVE   RBC / HPF 0-5 0 - 5 RBC/hpf   WBC, UA 0-5 0 - 5 WBC/hpf   Bacteria, UA NONE SEEN NONE SEEN   Squamous Epithelial / LPF 6-10 0 - 5    Mucus PRESENT     Comment: Performed at [facility], [address] [place], [place] [address]  Mononucleosis screen     Status: None   Collection Time: 01/24/20 12:53 PM  Result Value Ref Range   Mono Screen NEGATIVE NEGATIVE    Comment: Performed at [facility], [address] [place], [place] [address]  Pregnancy, urine POC     Status: None   Collection Time: 01/24/20  1:01 PM  Result Value Ref Range   Preg Test, Ur NEGATIVE NEGATIVE    Comment:        THE SENSITIVITY OF THIS METHODOLOGY IS >24 mIU/mL    CT Soft Tissue Neck W Contrast  Result Date: 01/24/2020 CLINICAL DATA:  Neck swelling EXAM: CT NECK WITH CONTRAST TECHNIQUE: Multidetector CT imaging of the neck was performed using the standard protocol following the bolus administration of intravenous contrast. CONTRAST:  84mL OMNIPAQUE IOHEXOL 300 MG/ML  SOLN COMPARISON:  None. FINDINGS: Pharynx and larynx: Within the dorsal left nasopharyngeal wall there is a 1.0 x 0.8 cm peripherally enhancing fluid collection. Anteromedial displacement of the left adenoid and tubal tonsil. The fluid collection tracks inferiorly along the left parapharyngeal space with anterior displacement of the left palatine tonsil. Inferiorly the peripherally enhancing collection measures up to 2.7 x 1.4 cm (3:37) at the level of the lingual tonsil which is medially displaced. The collection measures 4.6 cm in cranial caudal dimension (6:49). Inflammatory stranding within the left parapharyngeal fat. Mild narrowing of the pharyngeal airway. Partial effacement of the left vallecula and piriform sinus. Normal appearance of the hypopharynx. The glottic and subglottic larynx are unremarkable. Salivary glands: No inflammation, mass, or stone. Thyroid: Normal. Lymph nodes: Enlarged left level 1B, bilateral 2A and 2B nodes are reactive. Prominent nodes within the bilateral level 4 and left level 5 regions are also reactive. Vascular: Normal  intravascular enhancement. Limited intracranial: Grossly unremarkable. Visualized orbits: Normal orbits. Mastoids and visualized paranasal sinuses: Mild ethmoid sinus mucosal thickening. Clear mastoid air cells. Skeleton: No acute or suspicious osseous abnormalities. Upper chest: Minimal atelectasis. Other:  None. IMPRESSION: Left peritonsillar abscess extending from the level of the nasopharynx to the level of the mandibular angle measuring up to 4.6 x 2.7 x 1.4 cm. Mild narrowing of the pharyngeal airway. Reactive bilateral cervical adenopathy. These results were called by telephone at the time of interpretation on 01/24/2020 at 6:28 pm to provider PA Greig Right, who verbally acknowledged these results. Electronically Signed   By: Stana Bunting M.D.   On: 01/24/2020 18:30    Review of Systems  Blood pressure 115/77, pulse 99, temperature (!) 100.4 F (38 C), temperature source Oral, resp. rate 16, height 5\' 8"  (1.727 m), weight 54.4 kg, last menstrual period 01/12/2020, SpO2 97 %. Physical Exam : The patient is awake and alert and very cooperative.  Her nose is open and clear.  Her oropharynx shows severe trismus and she cannot get her mouth open more than about 1 cm.  She has some swelling the left anterior tonsillar pillar and the tonsils pushed forward and medially with some exudate.  I cannot see the posterior pharyngeal wall well.  The mouth has a very strong odor.  Her neck is thin and easily palpable.  There is some tenderness and swelling under the left angle of the jaw but no other specific nodes are palpable.  She has no thyromegaly.  Her lungs are clear to auscultation.  Her heart shows regular rate and rhythm without murmur.  Her abdomen is benign.  Her extremities are without deformity or any swelling.  I reviewed her CT scan which shows evidence of an abscess in the left peritonsillar area that extends from the palate and nasopharyngeal area down to the hypopharynx.  This is over 4 cm  in length and about 2-1/2 cm in width.  Assessment/Plan She has a large left peritonsillar abscess with severe trismus and we are unable to get her mouth open well enough to consider aspirating.  She will need to go to the operating room for general anesthesia so that we can get her mouth open and control her airway while the left peritonsillar abscess is drained.  I explained this to her in detail and will plan to keep her in the hospital overnight on IV antibiotics and hopefully sent home in the morning if she is eating and drinking well and can take oral antibiotics.  She understands the surgery and the potential risks and has no further questions.  Informed surgical request is signed.  03/13/2020, MD 01/24/2020, 7:28 PM

## 2020-01-24 NOTE — ED Notes (Signed)
Pt's clothes in plastic bag, pt only wearing hospital gown.

## 2020-01-24 NOTE — Anesthesia Procedure Notes (Signed)
Procedure Name: Intubation Date/Time: 01/24/2020 8:19 PM Performed by: Estanislado Emms, CRNA Pre-anesthesia Checklist: Patient identified, Patient being monitored, Timeout performed, Emergency Drugs available and Suction available Patient Re-evaluated:Patient Re-evaluated prior to induction Oxygen Delivery Method: Circle system utilized Preoxygenation: Pre-oxygenation with 100% oxygen Induction Type: IV induction Ventilation: Mask ventilation without difficulty Laryngoscope Size: McGraph and 3 Grade View: Grade I Tube type: Oral Tube size: 6.0 mm Number of attempts: 1 Airway Equipment and Method: Stylet Placement Confirmation: ETT inserted through vocal cords under direct vision,  positive ETCO2 and breath sounds checked- equal and bilateral Secured at: 20 cm Tube secured with: Tape Dental Injury: Teeth and Oropharynx as per pre-operative assessment

## 2020-01-24 NOTE — ED Triage Notes (Signed)
Pt arrived via POV with c/o sore throat and mouth pain, x 6 days, pt seen at [facility] on 8/10 had neg strep and neg covid, states the pain is worse, unable to open mouth due to pain and pain with swallowing.  Pt states the pain is worse on the L side, with facial swelling.

## 2020-01-25 ENCOUNTER — Encounter: Payer: Self-pay | Admitting: Otolaryngology

## 2020-01-25 DIAGNOSIS — J36 Peritonsillar abscess: Secondary | ICD-10-CM | POA: Diagnosis not present

## 2020-01-25 NOTE — Discharge Summary (Signed)
Physician Discharge Summary  Patient ID: Sandra Dennis MRN: [id number] DOB/AGE: April 16, 2001 19 y.o.  Admit date: 01/24/2020 Discharge date: 01/25/2020  Admission Diagnoses: Left peritonsillar abscess  Discharge Diagnoses:  Active Problems:   Peritonsillar abscess   Discharged Condition: good  Hospital Course: The patient was admitted last night and an I&D of the left peritonsillar abscess was performed in the operating room under general anesthesia.  She is feeling much better this morning and is swallowing liquids well.  She will be discharged home using Augmentin pills a 75 twice a day for the next 9 days.  I have given her a Medrol Dosepak and she will also have some Tylenol with hydrocodone for pain.  She has not had enough tonsil problems that she would require tonsillectomy right now, but if she continues to have tonsil problems then her tonsils should be removed in the future.  Consults: None  Significant Diagnostic Studies: microbiology: wound culture: Pending  Treatments: surgery: I&D left peritonsillar abscess  Discharge Exam: Blood pressure 116/72, pulse 66, temperature 98 F (36.7 C), temperature source Oral, resp. rate 18, height 5\' 8"  (1.727 m), weight 54.4 kg, last menstrual period 01/12/2020, SpO2 100 %. Her oropharynx shows the incision to be closing down well.  There is no significant swelling there.  She is able to swallow well.  Her neck is not swollen at all.  She is feeling much better.  Disposition: She is to be discharged home   Allergies as of 01/25/2020      Reactions   Pollen Extract Swelling   Itchy eyes, runny nose Itchy eyes, runny nose      Medication List    TAKE these medications   albuterol 108 (90 Base) MCG/ACT inhaler Commonly known as: VENTOLIN HFA Inhale 2 puffs into the lungs every 6 (six) hours as needed for wheezing or shortness of breath.   etonogestrel 68 MG Impl implant Commonly known as: NEXPLANON 68 mg by Subdermal route  once.        Signed8/17/2021 01/25/2020, 8:49 AM

## 2020-01-25 NOTE — Plan of Care (Signed)
Patient received discharge teaching and is in stable condition.

## 2020-01-28 ENCOUNTER — Ambulatory Visit: Payer: Self-pay | Admitting: Adult Health

## 2020-01-28 LAB — AEROBIC/ANAEROBIC CULTURE W GRAM STAIN (SURGICAL/DEEP WOUND)

## 2020-01-29 LAB — CULTURE, BLOOD (SINGLE)
Culture: NO GROWTH
Special Requests: ADEQUATE

## 2020-01-29 NOTE — Anesthesia Postprocedure Evaluation (Signed)
Anesthesia Post Note  Patient: STARSHA MORNING  Procedure(s) Performed: INCISION AND DRAINAGE OF PERITONSILLAR ABCESS (N/A )  Patient location during evaluation: PACU Anesthesia Type: General Level of consciousness: awake and alert Pain management: pain level controlled Vital Signs Assessment: post-procedure vital signs reviewed and stable Respiratory status: spontaneous breathing, nonlabored ventilation, respiratory function stable and patient connected to nasal cannula oxygen Cardiovascular status: blood pressure returned to baseline and stable Postop Assessment: no apparent nausea or vomiting Anesthetic complications: no   No complications documented.   Last Vitals:  Vitals:   01/24/20 2250 01/25/20 0428  BP: 119/64 116/72  Pulse: 66 66  Resp: 18 18  Temp: 36.7 C 36.7 C  SpO2: 99% 100%    Last Pain:  Vitals:   01/25/20 1000  TempSrc:   PainSc: 2                  Yevette Edwards

## 2020-02-12 ENCOUNTER — Ambulatory Visit: Payer: Self-pay | Admitting: Adult Health

## 2020-02-24 ENCOUNTER — Telehealth: Payer: Self-pay | Admitting: Obstetrics and Gynecology

## 2020-02-24 NOTE — Telephone Encounter (Signed)
Patient is schedule for nexplanon removal and replacement along with Annual exam for 03/16/20 with SDJ. Patient will be by to side release of information form to transfer care for [facility]

## 2020-02-25 NOTE — Telephone Encounter (Signed)
Noted. Will order to arrive by apt date/time. 

## 2020-02-26 ENCOUNTER — Ambulatory Visit: Payer: 59 | Admitting: Adult Health

## 2020-03-02 ENCOUNTER — Ambulatory Visit: Payer: Self-pay

## 2020-03-16 ENCOUNTER — Other Ambulatory Visit (HOSPITAL_COMMUNITY)
Admission: RE | Admit: 2020-03-16 | Discharge: 2020-03-16 | Disposition: A | Discharge status: Home / Self Care | Payer: 59 | Source: Ambulatory Visit | Attending: Obstetrics and Gynecology | Admitting: Obstetrics and Gynecology

## 2020-03-16 ENCOUNTER — Ambulatory Visit (INDEPENDENT_AMBULATORY_CARE_PROVIDER_SITE_OTHER): Payer: 59 | Admitting: Obstetrics and Gynecology

## 2020-03-16 ENCOUNTER — Encounter: Payer: Self-pay | Admitting: Obstetrics and Gynecology

## 2020-03-16 ENCOUNTER — Other Ambulatory Visit: Payer: Self-pay

## 2020-03-16 VITALS — BP 122/74 | Ht 68.0 in | Wt 127.0 lb

## 2020-03-16 DIAGNOSIS — Z01419 Encounter for gynecological examination (general) (routine) without abnormal findings: Secondary | ICD-10-CM | POA: Diagnosis not present

## 2020-03-16 DIAGNOSIS — Z124 Encounter for screening for malignant neoplasm of cervix: Secondary | ICD-10-CM | POA: Insufficient documentation

## 2020-03-16 DIAGNOSIS — Z3046 Encounter for surveillance of implantable subdermal contraceptive: Secondary | ICD-10-CM | POA: Diagnosis not present

## 2020-03-16 DIAGNOSIS — Z113 Encounter for screening for infections with a predominantly sexual mode of transmission: Secondary | ICD-10-CM | POA: Insufficient documentation

## 2020-03-16 DIAGNOSIS — Z30011 Encounter for initial prescription of contraceptive pills: Secondary | ICD-10-CM

## 2020-03-16 DIAGNOSIS — Z1339 Encounter for screening examination for other mental health and behavioral disorders: Secondary | ICD-10-CM | POA: Diagnosis not present

## 2020-03-16 DIAGNOSIS — Z1331 Encounter for screening for depression: Secondary | ICD-10-CM

## 2020-03-16 MED ORDER — NORETHIN ACE-ETH ESTRAD-FE 1-20 MG-MCG PO TABS: 1.0000 | ORAL_TABLET | Freq: Every day | ORAL | 4 refills | Status: DC
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
## 2020-03-18 LAB — CYTOLOGY - PAP
Chlamydia: NEGATIVE
Comment: NEGATIVE
Comment: NEGATIVE
Comment: NORMAL
Diagnosis: NEGATIVE
Neisseria Gonorrhea: NEGATIVE
Trichomonas: NEGATIVE

## 2020-06-18 ENCOUNTER — Ambulatory Visit: Payer: Self-pay

## 2020-10-04 ENCOUNTER — Other Ambulatory Visit: Payer: Self-pay

## 2020-10-04 ENCOUNTER — Emergency Department: Payer: 59

## 2020-10-04 ENCOUNTER — Emergency Department
Admission: EM | Admit: 2020-10-04 | Discharge: 2020-10-04 | Disposition: A | Discharge status: Home / Self Care | Payer: 59 | Attending: Emergency Medicine | Admitting: Emergency Medicine

## 2020-10-04 DIAGNOSIS — W503XXA Accidental bite by another person, initial encounter: Secondary | ICD-10-CM

## 2020-10-04 DIAGNOSIS — S60222A Contusion of left hand, initial encounter: Secondary | ICD-10-CM | POA: Insufficient documentation

## 2020-10-04 DIAGNOSIS — S6992XA Unspecified injury of left wrist, hand and finger(s), initial encounter: Secondary | ICD-10-CM | POA: Diagnosis present

## 2020-10-04 DIAGNOSIS — Z23 Encounter for immunization: Secondary | ICD-10-CM | POA: Insufficient documentation

## 2020-10-04 DIAGNOSIS — J45909 Unspecified asthma, uncomplicated: Secondary | ICD-10-CM | POA: Insufficient documentation

## 2020-10-04 MED ORDER — AMOXICILLIN-POT CLAVULANATE 875-125 MG PO TABS: 1.0000 | ORAL_TABLET | Freq: Two times a day (BID) | ORAL | 0 refills | Status: AC

## 2020-10-04 MED ORDER — TETANUS-DIPHTH-ACELL PERTUSSIS 5-2.5-18.5 LF-MCG/0.5 IM SUSY
0.5000 mL | PREFILLED_SYRINGE | Freq: Once | INTRAMUSCULAR | Status: AC
  Administered 2020-10-04: 0.5 mL via INTRAMUSCULAR
  Filled 2020-10-04: qty 0.5
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
## 2020-10-04 NOTE — Discharge Instructions (Addendum)
Take the antibiotic as prescribed.  Wash the area with soap and water Return emergency department worsening Follow-up with orthopedics if not improving in 1 week
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
## 2020-11-10 ENCOUNTER — Ambulatory Visit: Payer: Self-pay

## 2021-02-22 IMAGING — CT CT NECK W/ CM
3 of 4 series · 12 of 33 positions shown, 14 images · IV contrast (omnipaque)
Comparison: None.

CLINICAL DATA: Neck swelling

EXAM:
CT NECK WITH CONTRAST
TECHNIQUE: Multidetector CT imaging of the neck was performed using the
standard protocol following the bolus administration of intravenous
contrast.
CONTRAST:  75mL OMNIPAQUE IOHEXOL 300 MG/ML  SOLN

[Series 6: sag neck · sagittal · 0.43mm/px · 5 of 83 slices shown, 6 images]
[im 28/83  bone]
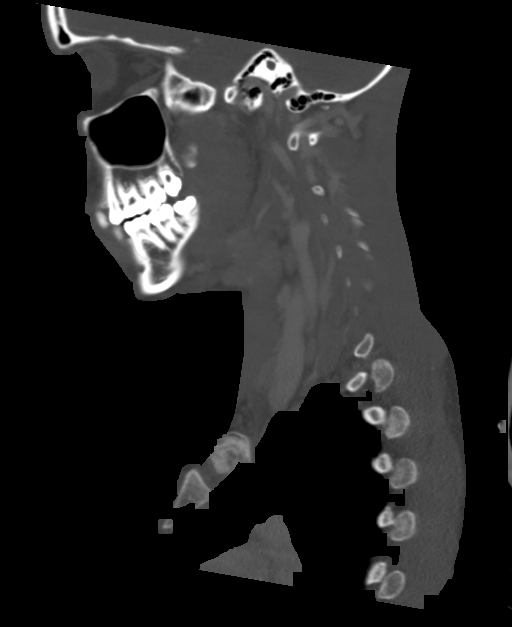
[im 35/83  bone]
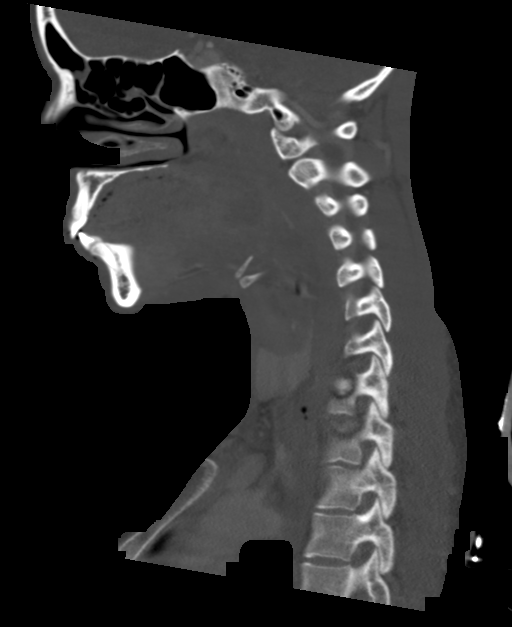
[im 42/83  soft-tissue]
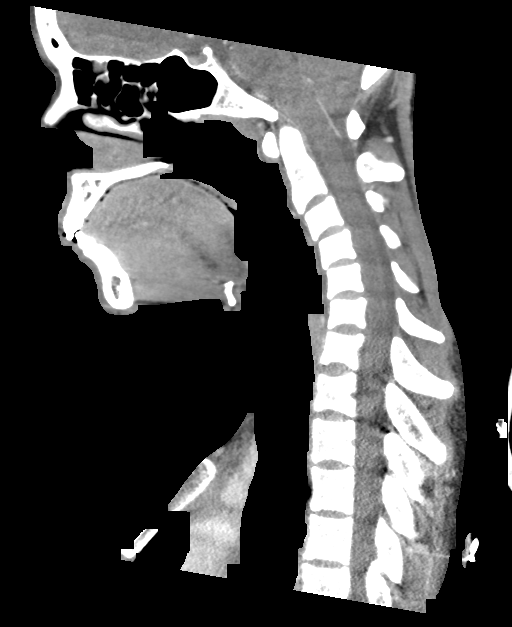
[im 42/83  bone]
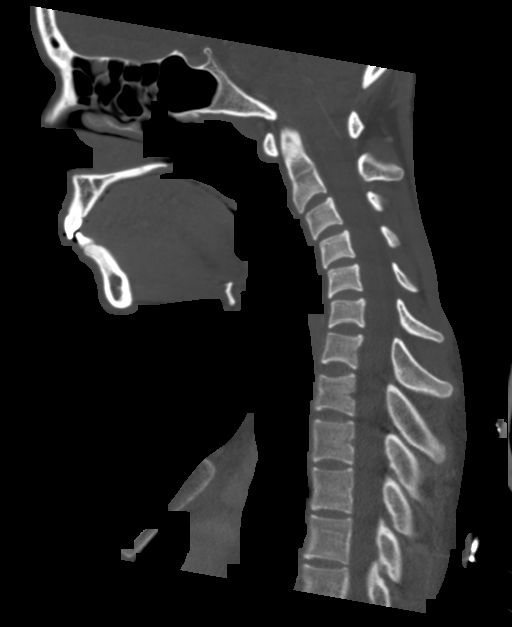
[im 48/83  bone]
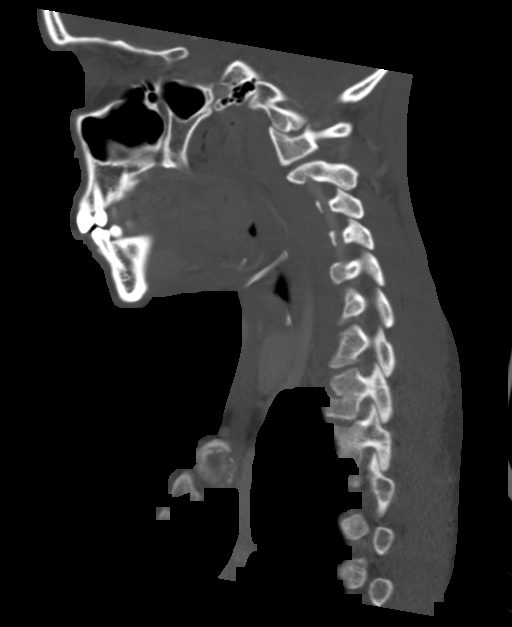
[im 55/83  bone]
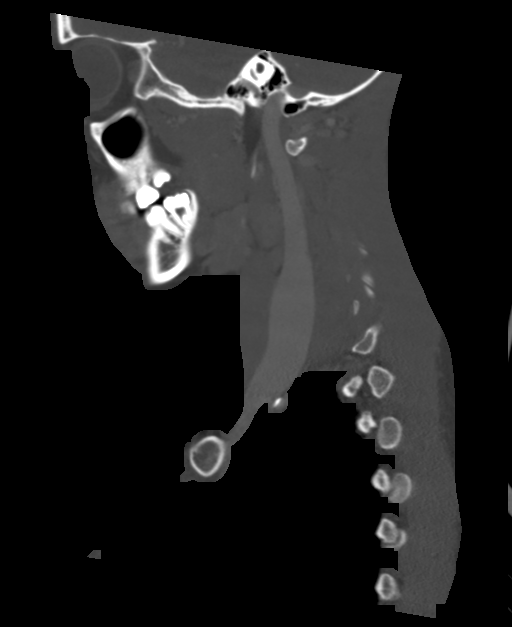

[Series 7: cor neck · coronal · 0.32mm/px · 3 of 97 slices shown]
[im 20/97  bone]
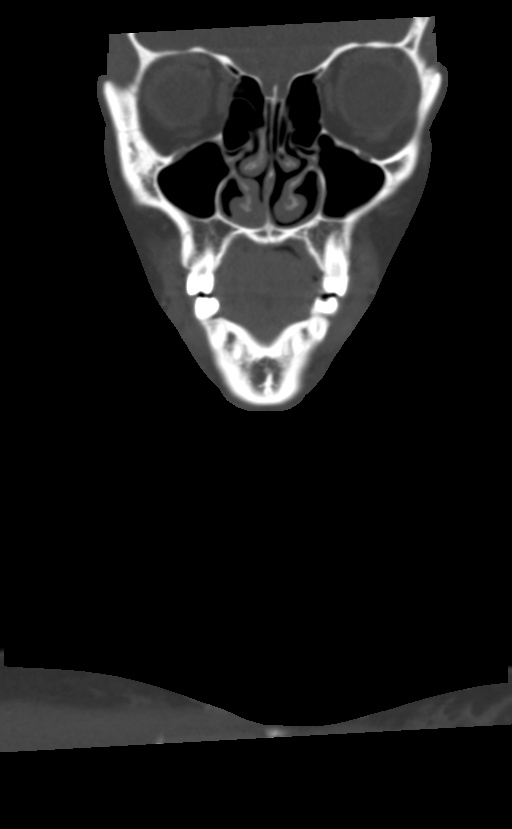
[im 39/97  bone]
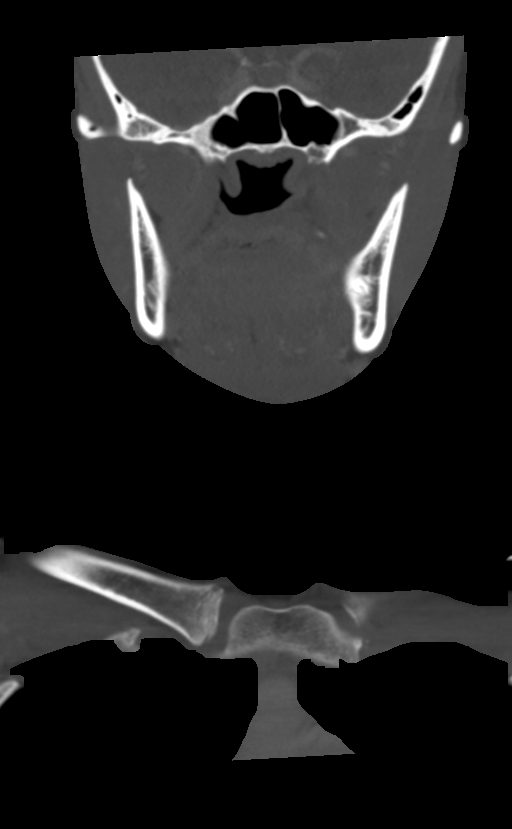
[im 58/97  bone]
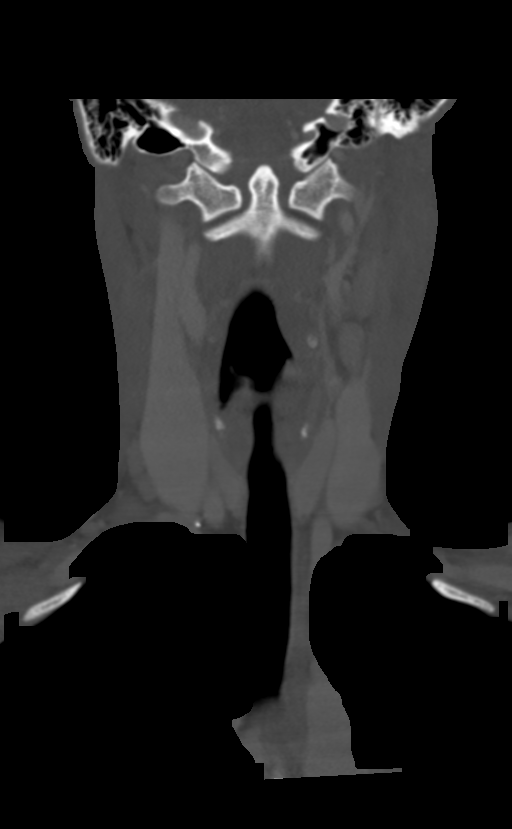

[Series 8: orthogonal ax · axial · 0.32mm/px · z∈[+187,+373]mm · 4 of 134 slices shown, 5 images]
[im 20/134  soft-tissue]
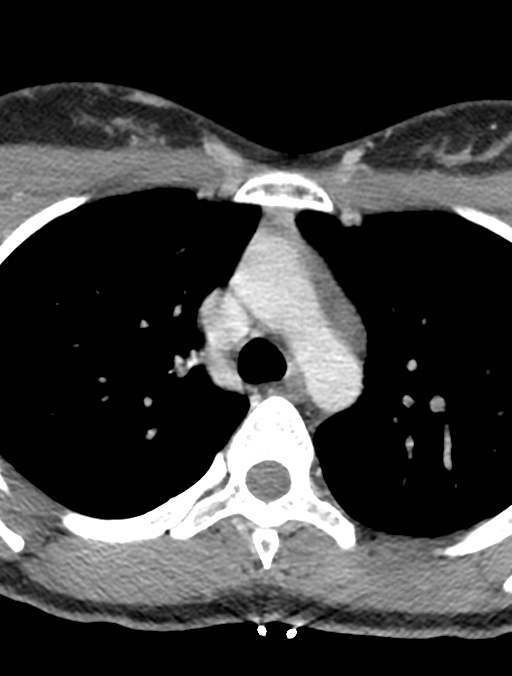
[im 20/134  bone]
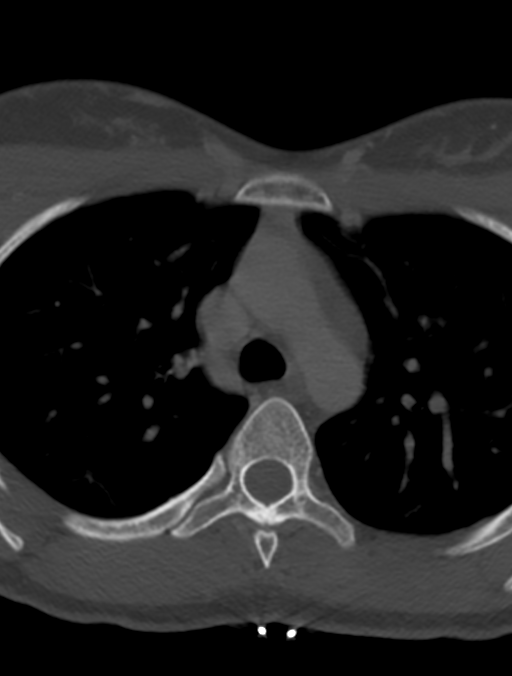
[im 58/134  bone]
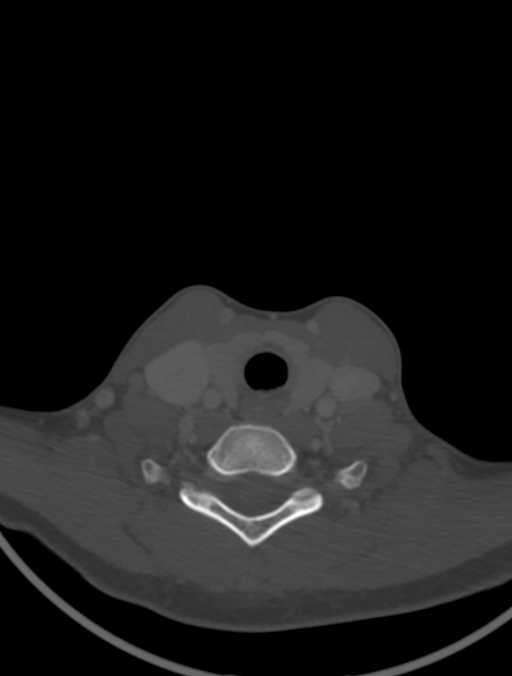
[im 77/134  bone]
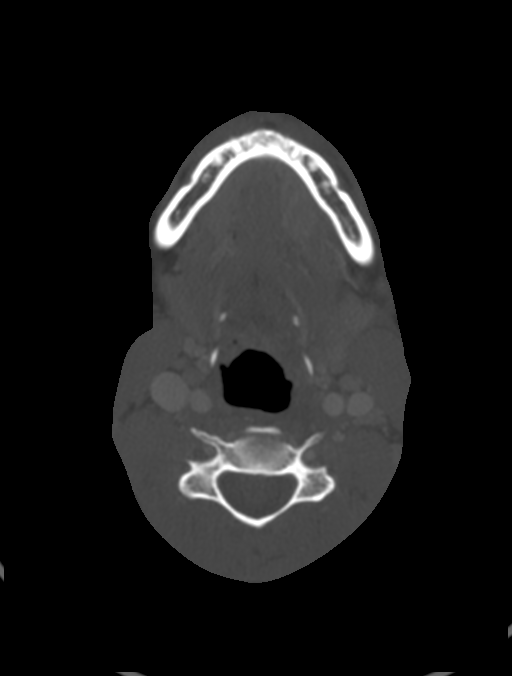
[im 115/134  bone]
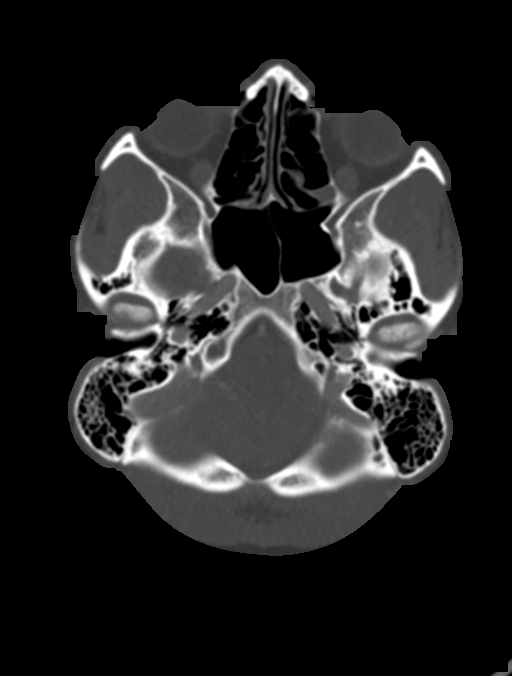

[12 of 33 positions shown; findings below may reference images not displayed]

FINDINGS: Pharynx and larynx: Within the dorsal left nasopharyngeal wall there
is a 1.0 x 0.8 cm peripherally enhancing fluid collection.
Anteromedial displacement of the left adenoid and tubal tonsil. The
fluid collection tracks inferiorly along the left parapharyngeal
space with anterior displacement of the left palatine tonsil.
Inferiorly the peripherally enhancing collection measures up to
x 1.4 cm ([DATE]) at the level of the lingual tonsil which is medially
displaced. The collection measures 4.6 cm in cranial caudal
dimension ([DATE]).

Inflammatory stranding within the left parapharyngeal fat. Mild
narrowing of the pharyngeal airway. Partial effacement of the left
vallecula and piriform sinus. Normal appearance of the hypopharynx.
The glottic and subglottic larynx are unremarkable.

Salivary glands: No inflammation, mass, or stone.

Thyroid: Normal.

Lymph nodes: Enlarged left level 1B, bilateral 2A and 2B nodes are
reactive. Prominent nodes within the bilateral level 4 and left
level 5 regions are also reactive.

Vascular: Normal intravascular enhancement.

Limited intracranial: Grossly unremarkable.

Visualized orbits: Normal orbits.

Mastoids and visualized paranasal sinuses: Mild ethmoid sinus
mucosal thickening. Clear mastoid air cells.

Skeleton: No acute or suspicious osseous abnormalities.

Upper chest: Minimal atelectasis.

Other: None.
IMPRESSION: Left peritonsillar abscess extending from the level of the
nasopharynx to the level of the mandibular angle measuring up to
x 2.7 x 1.4 cm.

Mild narrowing of the pharyngeal airway.

Reactive bilateral cervical adenopathy.

These results were called by telephone at the time of interpretation
on 01/24/2020 at [DATE] to provider PA Zackary Partin, who verbally
acknowledged these results.

## 2021-04-05 NOTE — Telephone Encounter (Signed)
Patient decided OCP's. Nexplanon not rcvd

## 2021-04-14 ENCOUNTER — Ambulatory Visit: Payer: Self-pay | Admitting: Obstetrics and Gynecology

## 2021-05-03 ENCOUNTER — Ambulatory Visit: Payer: 59 | Admitting: Obstetrics and Gynecology

## 2021-05-08 NOTE — Progress Notes (Signed)
PCP:  Patient, No Pcp Per (Inactive)   Chief Complaint  Patient presents with   Gynecologic Exam    No concerns     HPI:      Sandra Dennis is a 20 y.o. G1P1001 whose LMP was Patient's last menstrual period was 04/18/2021 (approximate)., presents today for her annual examination.  Her menses are regular every 28-30 days, lasting 3-5 days.  Dysmenorrhea mild, occurring first 1-2 days of flow. She does have intermenstrual bleeding with late/missed pills.  Sex activity: single partner, contraception - OCP (estrogen/progesterone). Nexplanon removed 03/16/20, started on OCPs. Last Pap: 03/16/20 Results were: no abnormalities  Hx of STDs: chlamydia in past  There is no FH of breast cancer. There is no FH of ovarian cancer. The patient does not do self-breast exams.  Tobacco use: The patient denies current or previous tobacco use. Alcohol use: social drinker No drug use.  Exercise: moderately active  She does get adequate calcium but not Vitamin D in her diet. Unsure if Carson done.  Patient Active Problem List   Diagnosis Date Noted   Peritonsillar abscess 01/24/2020    Past Surgical History:  Procedure Laterality Date   INCISION AND DRAINAGE OF PERITONSILLAR ABCESS N/A 01/24/2020   Procedure: INCISION AND DRAINAGE OF PERITONSILLAR ABCESS;  Surgeon: Margaretha Sheffield, MD;  Location: [facility] ORS;  Service: ENT;  Laterality: N/A;   THROAT SURGERY      Family History  Problem Relation Age of Onset   Breast cancer Neg Hx    Ovarian cancer Neg Hx     Social History   Socioeconomic History   Marital status: Significant Other    Spouse name: Not on file   Number of children: Not on file   Years of education: Not on file   Highest education level: Not on file  Occupational History   Not on file  Tobacco Use   Smoking status: Never   Smokeless tobacco: Never  Vaping Use   Vaping Use: Never used  Substance and Sexual Activity   Alcohol use: Yes   Drug use: Never   Sexual  activity: Yes    Birth control/protection: Pill, Condom  Other Topics Concern   Not on file  Social History Narrative   Not on file   Social Determinants of Health   Financial Resource Strain: Not on file  Food Insecurity: Not on file  Transportation Needs: Not on file  Physical Activity: Not on file  Stress: Not on file  Social Connections: Not on file  Intimate Partner Violence: Not on file     Current Outpatient Medications:    albuterol (PROVENTIL HFA;VENTOLIN HFA) 108 (90 Base) MCG/ACT inhaler, Inhale 2 puffs into the lungs every 6 (six) hours as needed for wheezing or shortness of breath., Disp: 1 Inhaler, Rfl: 2   betamethasone dipropionate (DIPROLENE) 0.05 % ointment, Apply topically., Disp: , Rfl:    hydrocortisone 2.5 % cream, Apply topically 2 (two) times daily., Disp: , Rfl:    norethindrone-ethinyl estradiol-FE (JUNEL FE 1/20) 1-20 MG-MCG tablet, Take 1 tablet by mouth daily., Disp: 84 tablet, Rfl: 3     ROS:  Review of Systems  Constitutional:  Negative for fatigue, fever and unexpected weight change.  Respiratory:  Negative for cough, shortness of breath and wheezing.   Cardiovascular:  Negative for chest pain, palpitations and leg swelling.  Gastrointestinal:  Negative for blood in stool, constipation, diarrhea, nausea and vomiting.  Endocrine: Negative for cold intolerance, heat intolerance and polyuria.  Genitourinary:  Negative for dyspareunia, dysuria, flank pain, frequency, genital sores, hematuria, menstrual problem, pelvic pain, urgency, vaginal bleeding, vaginal discharge and vaginal pain.  Musculoskeletal:  Negative for back pain, joint swelling and myalgias.  Skin:  Negative for rash.  Neurological:  Negative for dizziness, syncope, light-headedness, numbness and headaches.  Hematological:  Negative for adenopathy.  Psychiatric/Behavioral:  Negative for agitation, confusion, sleep disturbance and suicidal ideas. The patient is not nervous/anxious.    BREAST: No symptoms   Objective: BP 118/74   Ht 5\' 8"  (1.727 m)   Wt 132 lb (59.9 kg)   LMP 04/18/2021 (Approximate)   BMI 20.07 kg/m    Physical Exam Constitutional:      Appearance: She is well-developed.  Genitourinary:     Vulva normal.     Right Labia: No rash, tenderness or lesions.    Left Labia: No tenderness, lesions or rash.    No vaginal discharge, erythema or tenderness.      Right Adnexa: not tender and no mass present.    Left Adnexa: not tender and no mass present.    No cervical friability or polyp.     Uterus is not enlarged or tender.  Breasts:    Right: No mass, nipple discharge, skin change or tenderness.     Left: No mass, nipple discharge, skin change or tenderness.  Neck:     Thyroid: No thyromegaly.  Cardiovascular:     Rate and Rhythm: Normal rate and regular rhythm.     Heart sounds: Normal heart sounds. No murmur heard. Pulmonary:     Effort: Pulmonary effort is normal.     Breath sounds: Normal breath sounds.  Abdominal:     Palpations: Abdomen is soft.     Tenderness: There is no abdominal tenderness. There is no guarding or rebound.  Musculoskeletal:        General: Normal range of motion.     Cervical back: Normal range of motion.  Lymphadenopathy:     Cervical: No cervical adenopathy.  Neurological:     General: No focal deficit present.     Mental Status: She is alert and oriented to person, place, and time.     Cranial Nerves: No cranial nerve deficit.  Skin:    General: Skin is warm and dry.  Psychiatric:        Mood and Affect: Mood normal.        Behavior: Behavior normal.        Thought Content: Thought content normal.        Judgment: Judgment normal.  Vitals reviewed.    Assessment/Plan: Encounter for annual routine gynecological examination  Screening for STD (sexually transmitted disease) - Plan: Cervicovaginal ancillary only  Encounter for surveillance of contraceptive pills - Plan: norethindrone-ethinyl  estradiol-FE (JUNEL FE 1/20) 1-20 MG-MCG tablet; OCP RF. Has BTB with late/missed pills. Also offered xulane vs nuvaring. Pt to f/u prn. Condoms if late/missed pills. Also rule out STDs   Meds ordered this encounter  Medications   norethindrone-ethinyl estradiol-FE (JUNEL FE 1/20) 1-20 MG-MCG tablet    Sig: Take 1 tablet by mouth daily.    Dispense:  84 tablet    Refill:  3    Order Specific Question:   Supervising Provider    Answer:   2/20 Nadara Mustard              GYN counsel adequate intake of calcium and vitamin D, diet and exercise; gardasil discussed--pt to check with parents if  done     F/U  Return in about 1 year (around 05/09/2022).  Talisa Petrak B. Georgi Navarrete, PA-C 05/09/2021 10:41 AM

## 2021-05-09 ENCOUNTER — Encounter: Payer: Self-pay | Admitting: Obstetrics and Gynecology

## 2021-05-09 ENCOUNTER — Ambulatory Visit (INDEPENDENT_AMBULATORY_CARE_PROVIDER_SITE_OTHER): Payer: 59 | Admitting: Obstetrics and Gynecology

## 2021-05-09 ENCOUNTER — Other Ambulatory Visit: Payer: Self-pay

## 2021-05-09 ENCOUNTER — Other Ambulatory Visit (HOSPITAL_COMMUNITY)
Admission: RE | Admit: 2021-05-09 | Discharge: 2021-05-09 | Disposition: A | Discharge status: Home / Self Care | Payer: BC Managed Care – PPO | Source: Ambulatory Visit | Attending: Obstetrics and Gynecology | Admitting: Obstetrics and Gynecology

## 2021-05-09 VITALS — BP 118/74 | Ht 68.0 in | Wt 132.0 lb

## 2021-05-09 DIAGNOSIS — Z113 Encounter for screening for infections with a predominantly sexual mode of transmission: Secondary | ICD-10-CM | POA: Diagnosis not present

## 2021-05-09 DIAGNOSIS — Z01419 Encounter for gynecological examination (general) (routine) without abnormal findings: Secondary | ICD-10-CM

## 2021-05-09 DIAGNOSIS — Z3041 Encounter for surveillance of contraceptive pills: Secondary | ICD-10-CM

## 2021-05-09 MED ORDER — NORETHIN ACE-ETH ESTRAD-FE 1-20 MG-MCG PO TABS: 1.0000 | ORAL_TABLET | Freq: Every day | ORAL | 3 refills | Status: DC

## 2021-05-09 NOTE — Patient Instructions (Signed)
I value your feedback and you entrusting us with your care. If you get a [facility] patient survey, I would appreciate you taking the time to let us know about your experience today. Thank you! ? ? ?

## 2021-05-10 ENCOUNTER — Telehealth: Payer: Self-pay | Admitting: Obstetrics and Gynecology

## 2021-05-10 DIAGNOSIS — A749 Chlamydial infection, unspecified: Secondary | ICD-10-CM | POA: Insufficient documentation

## 2021-05-10 LAB — CERVICOVAGINAL ANCILLARY ONLY
Chlamydia: POSITIVE — AB
Comment: NEGATIVE
Comment: NORMAL
Neisseria Gonorrhea: NEGATIVE

## 2021-05-10 MED ORDER — AZITHROMYCIN 500 MG PO TABS: 1000.0000 mg | ORAL_TABLET | Freq: Once | ORAL | 0 refills | Status: AC
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
## 2021-05-16 NOTE — Telephone Encounter (Signed)
[facility] notified. 

## 2021-05-20 ENCOUNTER — Ambulatory Visit
Admission: RE | Admit: 2021-05-20 | Discharge: 2021-05-20 | Disposition: A | Discharge status: Home / Self Care | Payer: 59 | Source: Ambulatory Visit | Attending: Family Medicine | Admitting: Family Medicine

## 2021-05-20 ENCOUNTER — Other Ambulatory Visit: Payer: Self-pay

## 2021-05-20 VITALS — BP 108/71 | HR 70 | Temp 98.9°F | Resp 16

## 2021-05-20 DIAGNOSIS — Z113 Encounter for screening for infections with a predominantly sexual mode of transmission: Secondary | ICD-10-CM | POA: Insufficient documentation

## 2021-05-20 DIAGNOSIS — Z202 Contact with and (suspected) exposure to infections with a predominantly sexual mode of transmission: Secondary | ICD-10-CM | POA: Diagnosis not present

## 2021-05-20 LAB — POCT URINALYSIS DIP (MANUAL ENTRY)
Bilirubin, UA: NEGATIVE
Glucose, UA: NEGATIVE mg/dL
Ketones, POC UA: NEGATIVE mg/dL
Leukocytes, UA: NEGATIVE
Nitrite, UA: NEGATIVE
Protein Ur, POC: NEGATIVE mg/dL
Spec Grav, UA: 1.02 (ref 1.010–1.025)
Urobilinogen, UA: 0.2 E.U./dL
pH, UA: 6 (ref 5.0–8.0)

## 2021-05-20 NOTE — ED Provider Notes (Signed)
[name]    CSN: [id number] Arrival date & time: 05/20/21  4235      History   Chief Complaint Chief Complaint  Patient presents with   Exposure to STD    HPI Sandra Dennis is a 20 y.o. female.   HPI Patient presents today for STD recheck.  Patient was seen by her OB/GYN 2 weeks ago and was diagnosed with chlamydia.  She reports being treated with azithromycin and she is asymptomatic at present.  She reports not having any unprotected sex since testing positive for chlamydia. Past Medical History:  Diagnosis Date   Asthma     Patient Active Problem List   Diagnosis Date Noted   Chlamydia 05/10/2021   Peritonsillar abscess 01/24/2020    Past Surgical History:  Procedure Laterality Date   INCISION AND DRAINAGE OF PERITONSILLAR ABCESS N/A 01/24/2020   Procedure: INCISION AND DRAINAGE OF PERITONSILLAR ABCESS;  Surgeon: Vernie Murders, MD;  Location: [facility] ORS;  Service: ENT;  Laterality: N/A;   THROAT SURGERY      OB History     Gravida  1   Para  1   Term  1   Preterm      AB      Living  1      SAB      IAB      Ectopic      Multiple      Live Births  1            Home Medications    Prior to Admission medications   Medication Sig Start Date End Date Taking? Authorizing Provider  albuterol (PROVENTIL HFA;VENTOLIN HFA) 108 (90 Base) MCG/ACT inhaler Inhale 2 puffs into the lungs every 6 (six) hours as needed for wheezing or shortness of breath. 03/17/17   Fisher, Roselyn Bering, PA-C  betamethasone dipropionate (DIPROLENE) 0.05 % ointment Apply topically. 01/08/21   [provider]  hydrocortisone 2.5 % cream Apply topically 2 (two) times daily. 01/08/21   [provider]  norethindrone-ethinyl estradiol-FE (JUNEL FE 1/20) 1-20 MG-MCG tablet Take 1 tablet by mouth daily. 05/09/21   Copland, Ilona Sorrel, PA-C    Family History Family History  Problem Relation Age of Onset   Breast cancer Neg Hx    Ovarian cancer Neg Hx      Social History Social History   Tobacco Use   Smoking status: Never   Smokeless tobacco: Never  Vaping Use   Vaping Use: Never used  Substance Use Topics   Alcohol use: Yes   Drug use: Never     Allergies   Pollen extract   Review of Systems Review of Systems Pertinent negatives listed in HPI   Physical Exam Triage Vital Signs ED Triage Vitals [05/20/21 1019]  Enc Vitals Group     BP 108/71     Pulse Rate 70     Resp 16     Temp 98.9 F (37.2 C)     Temp Source Oral     SpO2 97 %     Weight      Height      Head Circumference      Peak Flow      Pain Score      Pain Loc      Pain Edu?      Excl. in GC?    No data found.  Updated Vital Signs BP 108/71 (BP Location: Left Arm)   Pulse 70   Temp  98.9 F (37.2 C) (Oral)   Resp 16   LMP 04/18/2021   SpO2 97%   Visual Acuity Right Eye Distance:   Left Eye Distance:   Bilateral Distance:    Right Eye Near:   Left Eye Near:    Bilateral Near:     Physical Exam General appearance: Alert, well developed, well nourished, cooperative  Head: Normocephalic, without obvious abnormality, atraumatic Respiratory: Respirations even and unlabored, normal respiratory rate Heart: Rate and rhythm normal.  Extremities: No gross deformities Skin: Skin color, texture, turgor normal. No rashes seen  Psych: Appropriate mood and affect. Neurologic: No obvious focal neurological deficit present on exam.  UC Treatments / Results  Labs (all labs ordered are listed, but only abnormal results are displayed) Labs Reviewed  POCT URINALYSIS DIP (MANUAL ENTRY) - Abnormal; Notable for the following components:      Result Value   Blood, UA trace-intact (*)    All other components within normal limits  POCT URINE PREGNANCY  CERVICOVAGINAL ANCILLARY ONLY    EKG   Radiology No results found.  Procedures Procedures (including critical care time)  Medications Ordered in UC Medications - No data to  display  Initial Impression / Assessment and Plan / UC Course  I have reviewed the triage vital signs and the nursing notes.  Pertinent labs & imaging results that were available during my care of the patient were reviewed by me and considered in my medical decision making (see chart for details).    Education provided to patient that retesting is typically not indicated within 2 weeks of testing positive as the cytology results could be falsely positive.  However given that patient was treated with azithromycin and has had STDs in the past new guidelines recommend treatment with doxycycline therefore if positive may consider treating with a course of doxycycline.  Discussed this with patient she verbalized understanding and agreement with plan.  Cytology pending. Final Clinical Impressions(s) / UC Diagnoses   Final diagnoses:  Screen for STD (sexually transmitted disease)     Discharge Instructions       Your cytology will result within 1 to 3 days.  Your chlamydia testing could possibly remain positive given the close proximity of when you were diagnosed as is not recommended to retest for a minimum of 6 -12 weeks. If your chlamydia results are positive we will refrain from retreating and have the retest in approximately 3 weeks.     ED Prescriptions   None    PDMP not reviewed this encounter.   Bing Neighbors, FNP 05/20/21 1114

## 2021-05-20 NOTE — ED Triage Notes (Signed)
Pt states she was treated for chlamydia 2 weeks ago, symptoms are gone and denies sexual intercourse since tx, wanting retest

## 2021-05-20 NOTE — Discharge Instructions (Addendum)
  Your cytology will result within 1 to 3 days.  Your chlamydia testing could possibly remain positive given the close proximity of when you were diagnosed as is not recommended to retest for a minimum of 6 -12 weeks. If your chlamydia results are positive we will refrain from retreating and have the retest in approximately 3 weeks.

## 2021-05-21 LAB — POCT URINE PREGNANCY: Preg Test, Ur: NEGATIVE

## 2021-05-23 LAB — CERVICOVAGINAL ANCILLARY ONLY
Bacterial Vaginitis (gardnerella): POSITIVE — AB
Chlamydia: POSITIVE — AB
Comment: NEGATIVE
Comment: NEGATIVE
Comment: NEGATIVE
Comment: NORMAL
Neisseria Gonorrhea: NEGATIVE
Trichomonas: NEGATIVE

## 2021-06-07 ENCOUNTER — Ambulatory Visit: Payer: 59 | Admitting: Obstetrics and Gynecology

## 2021-06-13 NOTE — Progress Notes (Signed)
Patient, No Pcp Per (Inactive)   Chief Complaint  Patient presents with   Follow-up    TOC    HPI:      [name] is a 21 y.o. G1P1001 whose LMP was Patient's last menstrual period was 05/30/2021 (exact date)., presents today for chlamydia TOC from 05/09/21, treated with azithro. Went to urgent care for STD testing 05/20/21 and still positive for chlamydia, even though pt wasn't sexually active at that time. No repeat treatment done. Her partner had negative testing so wasn't treated. They have been sex active, using condoms every time. Also tested positive for BV at urgent care but didn't have sx, so no treatment done. Pt denies vag d/c, itching, irritation, odor. No pelvic pain.    Patient Active Problem List   Diagnosis Date Noted   Chlamydia 05/10/2021   Peritonsillar abscess 01/24/2020    Past Surgical History:  Procedure Laterality Date   INCISION AND DRAINAGE OF PERITONSILLAR ABCESS N/A 01/24/2020   Procedure: INCISION AND DRAINAGE OF PERITONSILLAR ABCESS;  Surgeon: Vernie MurdersJuengel, Paul, MD;  Location: [facility] ORS;  Service: ENT;  Laterality: N/A;   THROAT SURGERY      Family History  Problem Relation Age of Onset   Breast cancer Neg Hx    Ovarian cancer Neg Hx     Social History   Socioeconomic History   Marital status: Significant Other    Spouse name: Not on file   Number of children: Not on file   Years of education: Not on file   Highest education level: Not on file  Occupational History   Not on file  Tobacco Use   Smoking status: Never   Smokeless tobacco: Never  Vaping Use   Vaping Use: Never used  Substance and Sexual Activity   Alcohol use: Yes   Drug use: Never   Sexual activity: Yes    Birth control/protection: Pill, Condom  Other Topics Concern   Not on file  Social History Narrative   Not on file   Social Determinants of Health   Financial Resource Strain: Not on file  Food Insecurity: Not on file  Transportation Needs: Not on  file  Physical Activity: Not on file  Stress: Not on file  Social Connections: Not on file  Intimate Partner Violence: Not on file    Outpatient Medications Prior to Visit  Medication Sig Dispense Refill   albuterol (PROVENTIL HFA;VENTOLIN HFA) 108 (90 Base) MCG/ACT inhaler Inhale 2 puffs into the lungs every 6 (six) hours as needed for wheezing or shortness of breath. 1 Inhaler 2   betamethasone dipropionate (DIPROLENE) 0.05 % ointment Apply topically.     hydrocortisone 2.5 % cream Apply topically 2 (two) times daily.     norethindrone-ethinyl estradiol-FE (JUNEL FE 1/20) 1-20 MG-MCG tablet Take 1 tablet by mouth daily. 84 tablet 3   No facility-administered medications prior to visit.      ROS:  Review of Systems  Constitutional:  Negative for fever.  Gastrointestinal:  Negative for blood in stool, constipation, diarrhea, nausea and vomiting.  Genitourinary:  Negative for dyspareunia, dysuria, flank pain, frequency, hematuria, urgency, vaginal bleeding, vaginal discharge and vaginal pain.  Musculoskeletal:  Negative for back pain.  Skin:  Negative for rash.  BREAST: No symptoms   OBJECTIVE:   Vitals:  BP 110/60    Ht 5\' 8"  (1.727 m)    Wt 130 lb (59 kg)    LMP 05/30/2021 (Exact Date)    BMI 19.77 kg/m  Physical Exam Vitals reviewed.  Constitutional:      Appearance: She is well-developed.  Pulmonary:     Effort: Pulmonary effort is normal.  Genitourinary:    General: Normal vulva.     Pubic Area: No rash.      Labia:        Right: No rash, tenderness or lesion.        Left: No rash, tenderness or lesion.      Vagina: Normal. No vaginal discharge, erythema or tenderness.     Cervix: Normal.     Uterus: Normal. Not enlarged and not tender.      Adnexa: Right adnexa normal and left adnexa normal.       Right: No mass or tenderness.         Left: No mass or tenderness.    Musculoskeletal:        General: Normal range of motion.     Cervical back: Normal range  of motion.  Skin:    General: Skin is warm and dry.  Neurological:     General: No focal deficit present.     Mental Status: She is alert and oriented to person, place, and time.  Psychiatric:        Mood and Affect: Mood normal.        Behavior: Behavior normal.        Thought Content: Thought content normal.        Judgment: Judgment normal.    Assessment/Plan: Chlamydia - Plan: Cervicovaginal ancillary only; TOC today. Will f/u if positive. Recommended partner do tx even if tested negative since female testing has false negatives. F/u prn.   Screening for STD (sexually transmitted disease) - Plan: Cervicovaginal ancillary only    Return if symptoms worsen or fail to improve.  Rubi Tooley B. Saamir Armstrong, PA-C 06/14/2021 9:11 AM

## 2021-06-14 ENCOUNTER — Other Ambulatory Visit (HOSPITAL_COMMUNITY)
Admission: RE | Admit: 2021-06-14 | Discharge: 2021-06-14 | Disposition: A | Discharge status: Home / Self Care | Payer: BC Managed Care – PPO | Source: Ambulatory Visit | Attending: Obstetrics and Gynecology | Admitting: Obstetrics and Gynecology

## 2021-06-14 ENCOUNTER — Ambulatory Visit (INDEPENDENT_AMBULATORY_CARE_PROVIDER_SITE_OTHER): Payer: BC Managed Care – PPO | Admitting: Obstetrics and Gynecology

## 2021-06-14 ENCOUNTER — Other Ambulatory Visit: Payer: Self-pay

## 2021-06-14 ENCOUNTER — Encounter: Payer: Self-pay | Admitting: Obstetrics and Gynecology

## 2021-06-14 VITALS — BP 110/60 | Ht 68.0 in | Wt 130.0 lb

## 2021-06-14 DIAGNOSIS — A749 Chlamydial infection, unspecified: Secondary | ICD-10-CM | POA: Insufficient documentation

## 2021-06-14 DIAGNOSIS — Z113 Encounter for screening for infections with a predominantly sexual mode of transmission: Secondary | ICD-10-CM | POA: Insufficient documentation

## 2021-06-15 LAB — CERVICOVAGINAL ANCILLARY ONLY
Chlamydia: NEGATIVE
Comment: NEGATIVE
Comment: NEGATIVE
Comment: NORMAL
Neisseria Gonorrhea: NEGATIVE
Trichomonas: NEGATIVE

## 2021-06-28 ENCOUNTER — Ambulatory Visit: Payer: 59
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
## 2021-12-07 ENCOUNTER — Ambulatory Visit: Payer: Self-pay

## 2021-12-14 ENCOUNTER — Ambulatory Visit: Payer: Self-pay

## 2022-01-18 ENCOUNTER — Other Ambulatory Visit: Payer: Self-pay

## 2022-01-18 ENCOUNTER — Ambulatory Visit
Admission: RE | Admit: 2022-01-18 | Discharge: 2022-01-18 | Disposition: A | Discharge status: Home / Self Care | Payer: BC Managed Care – PPO | Source: Ambulatory Visit | Attending: Family Medicine | Admitting: Family Medicine

## 2022-01-18 VITALS — BP 131/76 | HR 76 | Temp 97.9°F | Resp 16

## 2022-01-18 DIAGNOSIS — N760 Acute vaginitis: Secondary | ICD-10-CM | POA: Insufficient documentation

## 2022-01-18 DIAGNOSIS — R8290 Unspecified abnormal findings in urine: Secondary | ICD-10-CM | POA: Diagnosis not present

## 2022-01-18 DIAGNOSIS — N926 Irregular menstruation, unspecified: Secondary | ICD-10-CM | POA: Insufficient documentation

## 2022-01-18 LAB — POCT URINALYSIS DIP (MANUAL ENTRY)
Bilirubin, UA: NEGATIVE
Glucose, UA: NEGATIVE mg/dL
Ketones, POC UA: NEGATIVE mg/dL
Nitrite, UA: NEGATIVE
Protein Ur, POC: NEGATIVE mg/dL
Spec Grav, UA: 1.025 (ref 1.010–1.025)
Urobilinogen, UA: 0.2 E.U./dL
pH, UA: 6 (ref 5.0–8.0)

## 2022-01-18 LAB — POCT URINE PREGNANCY: Preg Test, Ur: NEGATIVE

## 2022-01-18 NOTE — ED Provider Notes (Signed)
Sandra Dennis    CSN: [id number] Arrival date & time: 01/18/22  9563      History   Chief Complaint Chief Complaint  Patient presents with  . Vaginal Discharge    Missed period in July and having unusual amounts of discharge since but did have a period for august but still spotting since - Entered by patient  . Irregular Heart Beat    HPI Sandra Dennis is a 21 y.o. female.   HPI Patient presents today concern about abnormal vaginal discharge and menstrual irregularities over the past two months. She is prescribed OCP   Past Medical History:  Diagnosis Date  . Asthma     Patient Active Problem List   Diagnosis Date Noted  . Chlamydia 05/10/2021  . Peritonsillar abscess 01/24/2020    Past Surgical History:  Procedure Laterality Date  . INCISION AND DRAINAGE OF PERITONSILLAR ABCESS N/A 01/24/2020   Procedure: INCISION AND DRAINAGE OF PERITONSILLAR ABCESS;  Surgeon: Vernie Murders, MD;  Location: [facility] ORS;  Service: ENT;  Laterality: N/A;  . THROAT SURGERY      OB History     Gravida  1   Para  1   Term  1   Preterm      AB      Living  1      SAB      IAB      Ectopic      Multiple      Live Births  1            Home Medications    Prior to Admission medications   Medication Sig Start Date End Date Taking? Authorizing Provider  albuterol (PROVENTIL HFA;VENTOLIN HFA) 108 (90 Base) MCG/ACT inhaler Inhale 2 puffs into the lungs every 6 (six) hours as needed for wheezing or shortness of breath. 03/17/17   Fisher, Roselyn Bering, PA-C  betamethasone dipropionate (DIPROLENE) 0.05 % ointment Apply topically. 01/08/21   [provider]  hydrocortisone 2.5 % cream Apply topically 2 (two) times daily. 01/08/21   [provider]  norethindrone-ethinyl estradiol-FE (JUNEL FE 1/20) 1-20 MG-MCG tablet Take 1 tablet by mouth daily. 05/09/21   Copland, Ilona Sorrel, PA-C    Family History Family History  Problem Relation Age of Onset  .  Breast cancer Neg Hx   . Ovarian cancer Neg Hx     Social History Social History   Tobacco Use  . Smoking status: Never  . Smokeless tobacco: Never  Vaping Use  . Vaping Use: Never used  Substance Use Topics  . Alcohol use: Yes  . Drug use: Never     Allergies   Pollen extract   Review of Systems Review of Systems Pertinent negatives listed in HPI   Physical Exam Triage Vital Signs ED Triage Vitals  Enc Vitals Group     BP 01/18/22 0936 131/76     Pulse Rate 01/18/22 0936 76     Resp 01/18/22 0936 16     Temp 01/18/22 0936 97.9 F (36.6 C)     Temp Source 01/18/22 0936 Oral     SpO2 01/18/22 0936 97 %     Weight --      Height --      Head Circumference --      Peak Flow --      Pain Score 01/18/22 0941 0     Pain Loc --      Pain Edu? --  Excl. in GC? --    No data found.  Updated Vital Signs BP 131/76 (BP Location: Right Arm)   Pulse 76   Temp 97.9 F (36.6 C) (Oral)   Resp 16   LMP 01/10/2022   SpO2 97%   Visual Acuity Right Eye Distance:   Left Eye Distance:   Bilateral Distance:    Right Eye Near:   Left Eye Near:    Bilateral Near:     Physical Exam General appearance: alert, well developed, well nourished, cooperative and in no distress Head: Normocephalic, without obvious abnormality, atraumatic Heart: Rate and rhythm normal.   Respiratory: Respirations even and unlabored, normal respiratory rate Extremities: No gross deformities Skin: Skin color, texture, turgor normal. No rashes seen  Psych: Appropriate mood and affect.  UC Treatments / Results  Labs (all labs ordered are listed, but only abnormal results are displayed) Labs Reviewed  POCT URINALYSIS DIP (MANUAL ENTRY)  POCT URINE PREGNANCY  CERVICOVAGINAL ANCILLARY ONLY    EKG   Radiology No results found.  Procedures Procedures (including critical care time)  Medications Ordered in UC Medications - No data to display  Initial Impression / Assessment and  Plan / UC Course  I have reviewed the triage vital signs and the nursing notes.  Pertinent labs & imaging results that were available during my care of the patient were reviewed by me and considered in my medical decision making (see chart for details).     Final Clinical Impressions(s) / UC Diagnoses   Final diagnoses:  Vaginitis and vulvovaginitis  Missed period   Discharge Instructions   None    ED Prescriptions   None    PDMP not reviewed this encounter.

## 2022-01-18 NOTE — Discharge Instructions (Signed)
As discussed suspect the abnormal spotting is related to missing a few doses of your birth control pills and your body attempting to regulate to restarting birth control. If spotting persists beyond 2 weeks follow-up with your OBGYN.  Your labs will result within 72 hours. If any treatment is warrant after your labs results, our clinical staff will contact you.

## 2022-01-18 NOTE — ED Triage Notes (Signed)
Pt reports vag discharge and irregular periods . Last period 8-1 -23 . Pt did not have a period in July.

## 2022-01-19 ENCOUNTER — Telehealth: Payer: Self-pay | Admitting: Family Medicine

## 2022-01-19 LAB — CERVICOVAGINAL ANCILLARY ONLY
Bacterial Vaginitis (gardnerella): POSITIVE — AB
Candida Glabrata: NEGATIVE
Candida Vaginitis: NEGATIVE
Chlamydia: NEGATIVE
Comment: NEGATIVE
Comment: NEGATIVE
Comment: NEGATIVE
Comment: NEGATIVE
Comment: NEGATIVE
Comment: NORMAL
Neisseria Gonorrhea: NEGATIVE
Trichomonas: POSITIVE — AB

## 2022-01-19 LAB — URINE CULTURE: Special Requests: NORMAL

## 2022-01-19 MED ORDER — METRONIDAZOLE 500 MG PO TABS: 500.0000 mg | ORAL_TABLET | Freq: Two times a day (BID) | ORAL | 0 refills | Status: DC

## 2022-01-19 NOTE — Telephone Encounter (Signed)
Treatment for BV and metronidazole per recent cytology results. Metronidazole sent to pharmacy on file.

## 2022-01-26 ENCOUNTER — Ambulatory Visit (INDEPENDENT_AMBULATORY_CARE_PROVIDER_SITE_OTHER): Payer: Self-pay | Admitting: Obstetrics and Gynecology

## 2022-01-26 ENCOUNTER — Encounter: Payer: Self-pay | Admitting: Obstetrics and Gynecology

## 2022-01-26 VITALS — BP 100/70 | Ht 68.0 in | Wt 137.0 lb

## 2022-01-26 DIAGNOSIS — A599 Trichomoniasis, unspecified: Secondary | ICD-10-CM

## 2022-01-26 NOTE — Progress Notes (Signed)
Pt seen 01/18/22 at Urgent Care and diagnosed with trich and [facility]. Treated with flagyl. Sx improved, needs TOC. Partner hasn't done tx, but not sexually active since dx.  Too soon to do TOC. RTO in 3 wks for TOC. No sex with partner until a wk after he completed tx. Pt understands.

## 2022-02-14 ENCOUNTER — Telehealth: Payer: Self-pay | Admitting: Obstetrics and Gynecology

## 2022-02-14 ENCOUNTER — Ambulatory Visit: Payer: BC Managed Care – PPO | Admitting: Obstetrics and Gynecology

## 2022-02-14 NOTE — Telephone Encounter (Signed)
Ok, or pt can go to [facility] for TOC. Pls let pt know. Thx.

## 2022-02-14 NOTE — Telephone Encounter (Signed)
Patient called this afternoon to cancel an scheduled appointment from this AM. Patient was scheduled for TOC. I was advised by Helmut Muster Copland to contact patient to reschedule in 2 week. When I spoke with patient about rescheduled patient states she doesn't have th income to come in for this appointment that she will call back to reschedule at another time.

## 2022-02-16 ENCOUNTER — Ambulatory Visit: Payer: Self-pay | Admitting: Obstetrics and Gynecology

## 2022-02-20 NOTE — Telephone Encounter (Signed)
Patient is scheduled for 02/23/22

## 2022-02-22 NOTE — Progress Notes (Signed)
Patient, No Pcp Per   Chief Complaint  Patient presents with   STD testing    HPI:      [name] is a 21 y.o. G1P1001 whose LMP was Patient's last menstrual period was 02/08/2022 (exact date)., presents today for trich TOC, treated 8/23 with flagyl. Also had BV. All sx resolved. Not sexually active, not with prior partner since her tx (unsure if he did tx). Annual due 11/22  Patient Active Problem List   Diagnosis Date Noted   Chlamydia 05/10/2021   Peritonsillar abscess 01/24/2020    Past Surgical History:  Procedure Laterality Date   INCISION AND DRAINAGE OF PERITONSILLAR ABCESS N/A 01/24/2020   Procedure: INCISION AND DRAINAGE OF PERITONSILLAR ABCESS;  Surgeon: Vernie Murders, MD;  Location: [facility] ORS;  Service: ENT;  Laterality: N/A;   THROAT SURGERY      Family History  Problem Relation Age of Onset   Breast cancer Neg Hx    Ovarian cancer Neg Hx     Social History   Socioeconomic History   Marital status: Significant Other    Spouse name: Not on file   Number of children: Not on file   Years of education: Not on file   Highest education level: Not on file  Occupational History   Not on file  Tobacco Use   Smoking status: Never   Smokeless tobacco: Never  Vaping Use   Vaping Use: Never used  Substance and Sexual Activity   Alcohol use: Yes   Drug use: Never   Sexual activity: Yes    Birth control/protection: Pill, Condom  Other Topics Concern   Not on file  Social History Narrative   Not on file   Social Determinants of Health   Financial Resource Strain: Not on file  Food Insecurity: Not on file  Transportation Needs: Not on file  Physical Activity: Not on file  Stress: Not on file  Social Connections: Not on file  Intimate Partner Violence: Not on file    Outpatient Medications Prior to Visit  Medication Sig Dispense Refill   albuterol (PROVENTIL HFA;VENTOLIN HFA) 108 (90 Base) MCG/ACT inhaler Inhale 2 puffs into the lungs  every 6 (six) hours as needed for wheezing or shortness of breath. 1 Inhaler 2   norethindrone-ethinyl estradiol-FE (JUNEL FE 1/20) 1-20 MG-MCG tablet Take 1 tablet by mouth daily. 84 tablet 3   betamethasone dipropionate (DIPROLENE) 0.05 % ointment Apply topically. (Patient not taking: Reported on 01/26/2022)     hydrocortisone 2.5 % cream Apply topically 2 (two) times daily. (Patient not taking: Reported on 01/26/2022)     metroNIDAZOLE (FLAGYL) 500 MG tablet Take 1 tablet (500 mg total) by mouth 2 (two) times daily with a meal. DO NOT CONSUME ALCOHOL WHILE TAKING THIS MEDICATION. (Patient not taking: Reported on 01/26/2022) 14 tablet 0   No facility-administered medications prior to visit.      ROS:  Review of Systems  Constitutional:  Negative for fever.  Gastrointestinal:  Negative for blood in stool, constipation, diarrhea, nausea and vomiting.  Genitourinary:  Negative for dyspareunia, dysuria, flank pain, frequency, hematuria, urgency, vaginal bleeding, vaginal discharge and vaginal pain.  Musculoskeletal:  Negative for back pain.  Skin:  Negative for rash.   BREAST: No symptoms   OBJECTIVE:   Vitals:  BP 100/64   Ht 5\' 8"  (1.727 m)   Wt 137 lb (62.1 kg)   LMP 02/08/2022 (Exact Date)   BMI 20.83 kg/m   Physical Exam  Vitals reviewed.  Constitutional:      Appearance: She is well-developed.  Pulmonary:     Effort: Pulmonary effort is normal.  Genitourinary:    General: Normal vulva.     Pubic Area: No rash.      Labia:        Right: No rash, tenderness or lesion.        Left: No rash, tenderness or lesion.      Vagina: Normal. No vaginal discharge, erythema or tenderness.     Cervix: Normal.  Musculoskeletal:        General: Normal range of motion.     Cervical back: Normal range of motion.  Skin:    General: Skin is warm and dry.  Neurological:     General: No focal deficit present.     Mental Status: She is alert and oriented to person, place, and time.   Psychiatric:        Mood and Affect: Mood normal.        Behavior: Behavior normal.        Thought Content: Thought content normal.        Judgment: Judgment normal.     Assessment/Plan: Trichomonas infection - Plan: Cervicovaginal ancillary only; TOC today. Will f/u if pos.   Screening for STD (sexually transmitted disease) - Plan: Cervicovaginal ancillary only    Return if symptoms worsen or fail to improve.  Darianny Momon B. Shantice Menger, PA-C 02/23/2022 9:12 AM

## 2022-02-23 ENCOUNTER — Encounter: Payer: Self-pay | Admitting: Obstetrics and Gynecology

## 2022-02-23 ENCOUNTER — Other Ambulatory Visit (HOSPITAL_COMMUNITY)
Admission: RE | Admit: 2022-02-23 | Discharge: 2022-02-23 | Disposition: A | Discharge status: Home / Self Care | Payer: BC Managed Care – PPO | Source: Ambulatory Visit | Attending: Obstetrics and Gynecology | Admitting: Obstetrics and Gynecology

## 2022-02-23 ENCOUNTER — Ambulatory Visit: Payer: BC Managed Care – PPO | Admitting: Obstetrics and Gynecology

## 2022-02-23 VITALS — BP 100/64 | Ht 68.0 in | Wt 137.0 lb

## 2022-02-23 DIAGNOSIS — Z113 Encounter for screening for infections with a predominantly sexual mode of transmission: Secondary | ICD-10-CM

## 2022-02-23 DIAGNOSIS — A599 Trichomoniasis, unspecified: Secondary | ICD-10-CM | POA: Insufficient documentation

## 2022-02-23 NOTE — Patient Instructions (Signed)
I value your feedback and you entrusting us with your care. If you get a [facility] patient survey, I would appreciate you taking the time to let us know about your experience today. Thank you! ? ? ?

## 2022-02-27 LAB — CERVICOVAGINAL ANCILLARY ONLY
Chlamydia: NEGATIVE
Comment: NEGATIVE
Comment: NEGATIVE
Comment: NORMAL
Neisseria Gonorrhea: NEGATIVE
Trichomonas: NEGATIVE

## 2022-03-02 ENCOUNTER — Ambulatory Visit: Payer: BC Managed Care – PPO | Admitting: Obstetrics and Gynecology

## 2022-03-24 ENCOUNTER — Other Ambulatory Visit: Payer: Self-pay | Admitting: Obstetrics and Gynecology

## 2022-03-24 DIAGNOSIS — Z3041 Encounter for surveillance of contraceptive pills: Secondary | ICD-10-CM

## 2022-04-05 ENCOUNTER — Other Ambulatory Visit: Payer: Self-pay

## 2022-04-05 ENCOUNTER — Encounter: Payer: Self-pay | Admitting: Obstetrics and Gynecology

## 2022-04-05 DIAGNOSIS — Z3041 Encounter for surveillance of contraceptive pills: Secondary | ICD-10-CM

## 2022-04-05 MED ORDER — NORETHIN ACE-ETH ESTRAD-FE 1-20 MG-MCG PO TABS: 1.0000 | ORAL_TABLET | Freq: Every day | ORAL | 1 refills | Status: DC

## 2022-05-10 NOTE — Progress Notes (Unsigned)
PCP:  Patient, No Pcp Per   No chief complaint on file.    HPI:      Sandra Dennis is a 21 y.o. G1P1001 whose LMP was No LMP recorded., presents today for her annual examination.  Her menses are regular every 28-30 days, lasting 3-5 days.  Dysmenorrhea mild, occurring first 1-2 days of flow. She does have intermenstrual bleeding with late/missed pills.  Sex activity: single partner, contraception - OCP (estrogen/progesterone). Nexplanon removed 03/16/20, started on OCPs. Last Pap: 03/16/20 Results were: no abnormalities  Hx of STDs: chlamydia in past; trich 2023  There is no FH of breast cancer. There is no FH of ovarian cancer. The patient does not do self-breast exams.  Tobacco use: The patient denies current or previous tobacco use. Alcohol use: social drinker No drug use.  Exercise: moderately active  She does get adequate calcium but not Vitamin D in her diet. Unsure if [place] done.  Patient Active Problem List   Diagnosis Date Noted   Chlamydia 05/10/2021   Peritonsillar abscess 01/24/2020    Past Surgical History:  Procedure Laterality Date   INCISION AND DRAINAGE OF PERITONSILLAR ABCESS N/A 01/24/2020   Procedure: INCISION AND DRAINAGE OF PERITONSILLAR ABCESS;  Surgeon: Vernie Murders, MD;  Location: [facility] ORS;  Service: ENT;  Laterality: N/A;   THROAT SURGERY      Family History  Problem Relation Age of Onset   Breast cancer Neg Hx    Ovarian cancer Neg Hx     Social History   Socioeconomic History   Marital status: Significant Other    Spouse name: Not on file   Number of children: Not on file   Years of education: Not on file   Highest education level: Not on file  Occupational History   Not on file  Tobacco Use   Smoking status: Never   Smokeless tobacco: Never  Vaping Use   Vaping Use: Never used  Substance and Sexual Activity   Alcohol use: Yes   Drug use: Never   Sexual activity: Yes    Birth control/protection: Pill, Condom  Other  Topics Concern   Not on file  Social History Narrative   Not on file   Social Determinants of Health   Financial Resource Strain: Not on file  Food Insecurity: Not on file  Transportation Needs: Not on file  Physical Activity: Not on file  Stress: Not on file  Social Connections: Not on file  Intimate Partner Violence: Not on file     Current Outpatient Medications:    albuterol (PROVENTIL HFA;VENTOLIN HFA) 108 (90 Base) MCG/ACT inhaler, Inhale 2 puffs into the lungs every 6 (six) hours as needed for wheezing or shortness of breath., Disp: 1 Inhaler, Rfl: 2   norethindrone-ethinyl estradiol-FE (JUNEL FE 1/20) 1-20 MG-MCG tablet, Take 1 tablet by mouth daily., Disp: 84 tablet, Rfl: 1     ROS:  Review of Systems  Constitutional:  Negative for fatigue, fever and unexpected weight change.  Respiratory:  Negative for cough, shortness of breath and wheezing.   Cardiovascular:  Negative for chest pain, palpitations and leg swelling.  Gastrointestinal:  Negative for blood in stool, constipation, diarrhea, nausea and vomiting.  Endocrine: Negative for cold intolerance, heat intolerance and polyuria.  Genitourinary:  Negative for dyspareunia, dysuria, flank pain, frequency, genital sores, hematuria, menstrual problem, pelvic pain, urgency, vaginal bleeding, vaginal discharge and vaginal pain.  Musculoskeletal:  Negative for back pain, joint swelling and myalgias.  Skin:  Negative  for rash.  Neurological:  Negative for dizziness, syncope, light-headedness, numbness and headaches.  Hematological:  Negative for adenopathy.  Psychiatric/Behavioral:  Negative for agitation, confusion, sleep disturbance and suicidal ideas. The patient is not nervous/anxious.    BREAST: No symptoms   Objective: There were no vitals taken for this visit.   Physical Exam Constitutional:      Appearance: She is well-developed.  Genitourinary:     Vulva normal.     Right Labia: No rash, tenderness or  lesions.    Left Labia: No tenderness, lesions or rash.    No vaginal discharge, erythema or tenderness.      Right Adnexa: not tender and no mass present.    Left Adnexa: not tender and no mass present.    No cervical friability or polyp.     Uterus is not enlarged or tender.  Breasts:    Right: No mass, nipple discharge, skin change or tenderness.     Left: No mass, nipple discharge, skin change or tenderness.  Neck:     Thyroid: No thyromegaly.  Cardiovascular:     Rate and Rhythm: Normal rate and regular rhythm.     Heart sounds: Normal heart sounds. No murmur heard. Pulmonary:     Effort: Pulmonary effort is normal.     Breath sounds: Normal breath sounds.  Abdominal:     Palpations: Abdomen is soft.     Tenderness: There is no abdominal tenderness. There is no guarding or rebound.  Musculoskeletal:        General: Normal range of motion.     Cervical back: Normal range of motion.  Lymphadenopathy:     Cervical: No cervical adenopathy.  Neurological:     General: No focal deficit present.     Mental Status: She is alert and oriented to person, place, and time.     Cranial Nerves: No cranial nerve deficit.  Skin:    General: Skin is warm and dry.  Psychiatric:        Mood and Affect: Mood normal.        Behavior: Behavior normal.        Thought Content: Thought content normal.        Judgment: Judgment normal.  Vitals reviewed.     Assessment/Plan: Encounter for annual routine gynecological examination  Screening for STD (sexually transmitted disease) - Plan: Cervicovaginal ancillary only  Encounter for surveillance of contraceptive pills - Plan: norethindrone-ethinyl estradiol-FE (JUNEL FE 1/20) 1-20 MG-MCG tablet; OCP RF. Has BTB with late/missed pills. Also offered xulane vs nuvaring. Pt to f/u prn. Condoms if late/missed pills. Also rule out STDs   No orders of the defined types were placed in this encounter.             GYN counsel adequate intake of  calcium and vitamin D, diet and exercise; gardasil discussed--pt to check with parents if done     F/U  No follow-ups on file.  Teyanna Thielman B. Dafina Suk, PA-C 05/10/2022 2:52 PM

## 2022-05-11 ENCOUNTER — Ambulatory Visit (INDEPENDENT_AMBULATORY_CARE_PROVIDER_SITE_OTHER): Payer: BC Managed Care – PPO | Admitting: Obstetrics and Gynecology

## 2022-05-11 ENCOUNTER — Other Ambulatory Visit (HOSPITAL_COMMUNITY)
Admission: RE | Admit: 2022-05-11 | Discharge: 2022-05-11 | Disposition: A | Discharge status: Home / Self Care | Payer: BC Managed Care – PPO | Source: Ambulatory Visit | Attending: Obstetrics and Gynecology | Admitting: Obstetrics and Gynecology

## 2022-05-11 ENCOUNTER — Encounter: Payer: Self-pay | Admitting: Obstetrics and Gynecology

## 2022-05-11 VITALS — BP 100/60 | Ht 68.0 in | Wt 138.0 lb

## 2022-05-11 DIAGNOSIS — Z113 Encounter for screening for infections with a predominantly sexual mode of transmission: Secondary | ICD-10-CM | POA: Diagnosis not present

## 2022-05-11 DIAGNOSIS — Z3041 Encounter for surveillance of contraceptive pills: Secondary | ICD-10-CM

## 2022-05-11 DIAGNOSIS — Z01419 Encounter for gynecological examination (general) (routine) without abnormal findings: Secondary | ICD-10-CM | POA: Diagnosis not present

## 2022-05-11 MED ORDER — NORETHIN ACE-ETH ESTRAD-FE 1-20 MG-MCG PO TABS: 1.0000 | ORAL_TABLET | Freq: Every day | ORAL | 3 refills | Status: DC

## 2022-05-11 NOTE — Patient Instructions (Signed)
I value your feedback and you entrusting us with your care. If you get a [facility] patient survey, I would appreciate you taking the time to let us know about your experience today. Thank you! ? ? ?

## 2022-05-12 LAB — CERVICOVAGINAL ANCILLARY ONLY
Chlamydia: NEGATIVE
Comment: NEGATIVE
Comment: NEGATIVE
Comment: NORMAL
Neisseria Gonorrhea: NEGATIVE
Trichomonas: NEGATIVE

## 2022-09-15 ENCOUNTER — Ambulatory Visit: Payer: BC Managed Care – PPO

## 2022-09-25 ENCOUNTER — Ambulatory Visit: Payer: BC Managed Care – PPO | Admitting: Obstetrics and Gynecology

## 2022-09-25 ENCOUNTER — Encounter: Payer: Self-pay | Admitting: Obstetrics and Gynecology

## 2022-09-25 VITALS — BP 100/60 | Ht 68.0 in | Wt 146.0 lb

## 2022-09-25 DIAGNOSIS — N760 Acute vaginitis: Secondary | ICD-10-CM

## 2022-09-25 DIAGNOSIS — Z3041 Encounter for surveillance of contraceptive pills: Secondary | ICD-10-CM | POA: Diagnosis not present

## 2022-09-25 LAB — POCT WET PREP WITH KOH
Clue Cells Wet Prep HPF POC: NEGATIVE
KOH Prep POC: NEGATIVE
Trichomonas, UA: NEGATIVE
Yeast Wet Prep HPF POC: NEGATIVE

## 2022-09-25 MED ORDER — FLUCONAZOLE 150 MG PO TABS: 150.0000 mg | ORAL_TABLET | Freq: Once | ORAL | 0 refills | Status: AC

## 2022-09-25 MED ORDER — NORETHIN ACE-ETH ESTRAD-FE 1-20 MG-MCG PO TABS: 1.0000 | ORAL_TABLET | Freq: Every day | ORAL | 1 refills | Status: DC

## 2022-09-25 NOTE — Progress Notes (Signed)
Patient, No Pcp Per   Chief Complaint  Patient presents with   Vaginal Exam    Has had vaginal irritation since last Friday. No discharge, odor or itching. When wipes, noticed blood (started today)    HPI:      [name] is a 22 y.o. G1P1001 whose LMP was Patient's last menstrual period was 09/04/2022 (exact date)., presents today for external vaginal irritation/itching for 4 days. No increased d/c or odor. Had a little bit of blood ext when wiping today. No changes in soap/detergents. Doesn't use dryer sheets. Wears synthetic underwear, sometimes thongs. No recent abx use, used vaseline today to soothe skin. No UTI sx.  She is sexually active, no new partners.  On OCPs, neg STD testing 11/23. Has been late taking pills recently due to issues with getting at pharmacy.   Patient Active Problem List   Diagnosis Date Noted   Chlamydia 05/10/2021   Peritonsillar abscess 01/24/2020    Past Surgical History:  Procedure Laterality Date   INCISION AND DRAINAGE OF PERITONSILLAR ABCESS N/A 01/24/2020   Procedure: INCISION AND DRAINAGE OF PERITONSILLAR ABCESS;  Surgeon: Vernie Murders, MD;  Location: [facility] ORS;  Service: ENT;  Laterality: N/A;   THROAT SURGERY      Family History  Problem Relation Age of Onset   Breast cancer Neg Hx    Ovarian cancer Neg Hx     Social History   Socioeconomic History   Marital status: Single    Spouse name: Not on file   Number of children: Not on file   Years of education: Not on file   Highest education level: Not on file  Occupational History   Not on file  Tobacco Use   Smoking status: Never   Smokeless tobacco: Never  Vaping Use   Vaping Use: Never used  Substance and Sexual Activity   Alcohol use: Yes    Comment: socially   Drug use: Never   Sexual activity: Yes    Birth control/protection: Pill, Condom  Other Topics Concern   Not on file  Social History Narrative   Not on file   Social Determinants of Health    Financial Resource Strain: Not on file  Food Insecurity: Not on file  Transportation Needs: Not on file  Physical Activity: Not on file  Stress: Not on file  Social Connections: Not on file  Intimate Partner Violence: Not on file    Outpatient Medications Prior to Visit  Medication Sig Dispense Refill   albuterol (PROVENTIL HFA;VENTOLIN HFA) 108 (90 Base) MCG/ACT inhaler Inhale 2 puffs into the lungs every 6 (six) hours as needed for wheezing or shortness of breath. 1 Inhaler 2   norethindrone-ethinyl estradiol-FE (JUNEL FE 1/20) 1-20 MG-MCG tablet Take 1 tablet by mouth daily. 84 tablet 3   No facility-administered medications prior to visit.      ROS:  Review of Systems  Constitutional:  Negative for fever.  Gastrointestinal:  Negative for blood in stool, constipation, diarrhea, nausea and vomiting.  Genitourinary:  Positive for vaginal bleeding. Negative for dyspareunia, dysuria, flank pain, frequency, hematuria, urgency, vaginal discharge and vaginal pain.  Musculoskeletal:  Negative for back pain.  Skin:  Negative for rash.    OBJECTIVE:   Vitals:  BP 100/60   Ht  (1.727 m)   Wt 146 lb (66.2 kg)   LMP 09/04/2022 (Exact Date)   BMI 22.20 kg/m   Physical Exam Vitals reviewed.  Constitutional:  Appearance: She is well-developed.  Pulmonary:     Effort: Pulmonary effort is normal.  Genitourinary:    Pubic Area: No rash.      Labia:        Right: Rash present. No tenderness or lesion.        Left: Rash present. No tenderness or lesion.      Vagina: Vaginal discharge present. No erythema or tenderness.     Cervix: Normal.     Uterus: Normal. Not enlarged and not tender.      Adnexa: Right adnexa normal and left adnexa normal.       Right: No mass or tenderness.         Left: No mass or tenderness.         Comments: BILAT LABIA MAJORA AND MINORA WITH ERYTHEMA Musculoskeletal:        General: Normal range of motion.     Cervical back: Normal  range of motion.  Skin:    General: Skin is warm and dry.  Neurological:     General: No focal deficit present.     Mental Status: She is alert and oriented to person, place, and time.  Psychiatric:        Mood and Affect: Mood normal.        Behavior: Behavior normal.        Thought Content: Thought content normal.        Judgment: Judgment normal.    Results: Results for orders placed or performed in visit on 09/25/22 (from the past 24 hour(s))  POCT Wet Prep with KOH     Status: Normal   Collection Time: 09/25/22  3:10 PM  Result Value Ref Range   Trichomonas, UA Negative    Clue Cells Wet Prep HPF POC neg    Epithelial Wet Prep HPF POC     Yeast Wet Prep HPF POC neg    Bacteria Wet Prep HPF POC     RBC Wet Prep HPF POC     WBC Wet Prep HPF POC     KOH Prep POC Negative Negative     Assessment/Plan: Vulvovaginitis - Plan: fluconazole (DIFLUCAN) 150 MG tablet, POCT Wet Prep with KOH; pos sx and exam. Rx diflucan. F/u prn.   Encounter for surveillance of contraceptive pills - Plan: norethindrone-ethinyl estradiol-FE (JUNEL FE 1/20) 1-20 MG-MCG tablet; OCP to new pharmacy.    Meds ordered this encounter  Medications   norethindrone-ethinyl estradiol-FE (JUNEL FE 1/20) 1-20 MG-MCG tablet    Sig: Take 1 tablet by mouth daily.    Dispense:  84 tablet    Refill:  1   fluconazole (DIFLUCAN) 150 MG tablet    Sig: Take 1 tablet (150 mg total) by mouth once for 1 dose. May repeat in 3 days if still having symptoms    Dispense:  2 tablet    Refill:  0    Order Specific Question:   Supervising Provider    Answer:   Hildred Laser [[id number]]      Return if symptoms worsen or fail to improve.  Arend Bahl B. Arvilla Salada, PA-C 09/25/2022 3:10 PM

## 2022-09-25 NOTE — Patient Instructions (Signed)
I value your feedback and you entrusting us with your care. If you get a [facility] patient survey, I would appreciate you taking the time to let us know about your experience today. Thank you! ? ? ?

## 2022-10-17 DIAGNOSIS — Z113 Encounter for screening for infections with a predominantly sexual mode of transmission: Secondary | ICD-10-CM | POA: Diagnosis not present

## 2022-10-17 DIAGNOSIS — Z3202 Encounter for pregnancy test, result negative: Secondary | ICD-10-CM | POA: Diagnosis not present

## 2022-11-04 ENCOUNTER — Other Ambulatory Visit: Payer: Self-pay | Admitting: Obstetrics and Gynecology

## 2022-11-04 DIAGNOSIS — Z3041 Encounter for surveillance of contraceptive pills: Secondary | ICD-10-CM

## 2023-02-11 ENCOUNTER — Emergency Department
Admission: EM | Admit: 2023-02-11 | Discharge: 2023-02-11 | Disposition: A | Discharge status: Home / Self Care | Payer: BC Managed Care – PPO | Attending: Emergency Medicine | Admitting: Emergency Medicine

## 2023-02-11 ENCOUNTER — Encounter: Payer: Self-pay | Admitting: Intensive Care

## 2023-02-11 ENCOUNTER — Other Ambulatory Visit: Payer: Self-pay

## 2023-02-11 DIAGNOSIS — L03116 Cellulitis of left lower limb: Secondary | ICD-10-CM | POA: Diagnosis not present

## 2023-02-11 DIAGNOSIS — L539 Erythematous condition, unspecified: Secondary | ICD-10-CM | POA: Diagnosis not present

## 2023-02-11 LAB — COMPREHENSIVE METABOLIC PANEL
ALT: 22 U/L (ref 0–44)
AST: 20 U/L (ref 15–41)
Albumin: 3.8 g/dL (ref 3.5–5.0)
Alkaline Phosphatase: 39 U/L (ref 38–126)
Anion gap: 10 (ref 5–15)
BUN: 12 mg/dL (ref 6–20)
CO2: 25 mmol/L (ref 22–32)
Calcium: 9 mg/dL (ref 8.9–10.3)
Chloride: 102 mmol/L (ref 98–111)
Creatinine, Ser: 0.97 mg/dL (ref 0.44–1.00)
GFR, Estimated: >60 mL/min (ref >60)
Glucose, Bld: 93 mg/dL (ref 70–99)
Potassium: 4.3 mmol/L (ref 3.5–5.1)
Sodium: 137 mmol/L (ref 135–145)
Total Bilirubin: 1 mg/dL (ref 0.3–1.2)
Total Protein: 7.7 g/dL (ref 6.5–8.1)

## 2023-02-11 LAB — CBC WITH DIFFERENTIAL/PLATELET
Abs Immature Granulocytes: 0.02 10*3/uL (ref 0.00–0.07)
Basophils Absolute: 0 10*3/uL (ref 0.0–0.1)
Basophils Relative: 0 %
Eosinophils Absolute: 0.2 10*3/uL (ref 0.0–0.5)
Eosinophils Relative: 2 %
HCT: 41.4 % (ref 36.0–46.0)
Hemoglobin: 13.9 g/dL (ref 12.0–15.0)
Immature Granulocytes: 0 %
Lymphocytes Relative: 25 %
Lymphs Abs: 2.1 10*3/uL (ref 0.7–4.0)
MCH: 29.6 pg (ref 26.0–34.0)
MCHC: 33.6 g/dL (ref 30.0–36.0)
MCV: 88.1 fL (ref 80.0–100.0)
Monocytes Absolute: 0.6 10*3/uL (ref 0.1–1.0)
Monocytes Relative: 7 %
Neutro Abs: 5.5 10*3/uL (ref 1.7–7.7)
Neutrophils Relative %: 66 %
Platelets: 318 10*3/uL (ref 150–400)
RBC: 4.7 MIL/uL (ref 3.87–5.11)
RDW: 11.9 % (ref 11.5–15.5)
WBC: 8.5 10*3/uL (ref 4.0–10.5)
nRBC: 0 % (ref 0.0–0.2)

## 2023-02-11 MED ORDER — SULFAMETHOXAZOLE-TRIMETHOPRIM 800-160 MG PO TABS: 1.0000 | ORAL_TABLET | Freq: Two times a day (BID) | ORAL | 0 refills | Status: DC

## 2023-02-11 NOTE — ED Provider Notes (Signed)
[facility] Provider Note  Patient Contact: 6:56 PM (approximate)   History   Abscess   HPI  Sandra Dennis is a 22 y.o. female who presents the emergency department for evaluation of possible skin infection versus abscess to the left leg.  Patient has an erythematous area with several scattered pustules to the back of her left calf.  Patient denies any known injury.  No history of recurrent skin infections.  Patient does not believe she has been bitten by anything.     Physical Exam   Triage Vital Signs: ED Triage Vitals  Encounter Vitals Group     BP 02/11/23 1804 127/81     Systolic BP Percentile --      Diastolic BP Percentile --      Pulse Rate 02/11/23 1804 75     Resp 02/11/23 1804 15     Temp 02/11/23 1804 98.4 F (36.9 C)     Temp Source 02/11/23 1804 Oral     SpO2 02/11/23 1804 100 %     Weight 02/11/23 1805 152 lb (68.9 kg)     Height 02/11/23 1805 5\' 8"  (1.727 m)     Head Circumference --      Peak Flow --      Pain Score 02/11/23 1805 4     Pain Loc --      Pain Education --      Exclude from Growth Chart --     Most recent vital signs: Vitals:   02/11/23 1804  BP: 127/81  Pulse: 75  Resp: 15  Temp: 98.4 F (36.9 C)  SpO2: 100%     General: Alert and in no acute distress.  Cardiovascular:  Good peripheral perfusion Respiratory: Normal respiratory effort without tachypnea or retractions. Lungs CTAB.  Musculoskeletal: Full range of motion to all extremities.  Neurologic:  No gross focal neurologic deficits are appreciated.  Skin:   No rash noted.  Visualization of the back of the left calf reveals an erythematous area with a few scattered pustules.  Max diameter this is roughly 4 cm.  There is no fluctuance or induration.  This area appears to be cellulitic versus folliculitis Other:   ED Results / Procedures / Treatments   Labs (all labs ordered are listed, but only abnormal results are displayed) Labs Reviewed   CBC WITH DIFFERENTIAL/PLATELET  COMPREHENSIVE METABOLIC PANEL     EKG     RADIOLOGY    No results found.  PROCEDURES:  Critical Care performed: No  Procedures   MEDICATIONS ORDERED IN ED: Medications - No data to display   IMPRESSION / MDM / ASSESSMENT AND PLAN / ED COURSE  I reviewed the triage vital signs and the nursing notes.                                 Differential diagnosis includes, but is not limited to, cellulitis, folliculitis, insect bite, allergic reaction, abscess   Patient's presentation is most consistent with acute presentation with potential threat to life or bodily function.   Patient's diagnosis is consistent with cellulitis.  Patient presents the emergency department with an area to the back of the left calf.  This area is slightly erythematous with a few scattered pustules consistent with a folliculitis versus cellulitis.  No evidence of loculated fluid collection/abscess requiring incision and drainage.  Patient had labs which are reassuring.  At this time we  will place patient on antibiotics.  Return precautions discussed with the patient.  Follow-up primary care as needed..  Patient is given ED precautions to return to the ED for any worsening or new symptoms.     FINAL CLINICAL IMPRESSION(S) / ED DIAGNOSES   Final diagnoses:  Cellulitis of left lower extremity     Rx / DC Orders   ED Discharge Orders          Ordered    sulfamethoxazole-trimethoprim (BACTRIM DS) 800-160 MG tablet  2 times daily        02/11/23 1916             Note:  This document was prepared using Dragon voice recognition software and may include unintentional dictation errors.   Racheal Patches, PA-C 02/11/23 1916    Merwyn Katos, MD 02/16/23 1101

## 2023-02-11 NOTE — ED Notes (Signed)
See triage notes. Patient has a nickel sized cluster of white heads on the upper back side of her left calf that came up this morning.

## 2023-02-11 NOTE — ED Triage Notes (Signed)
Patient has quarter size abscess area on left calf. Reports waking up a few days ago with area present. Today she reports not feeling well with headache/nausea and fatigue.

## 2023-03-26 ENCOUNTER — Ambulatory Visit: Payer: BC Managed Care – PPO | Admitting: Licensed Practical Nurse

## 2023-04-05 ENCOUNTER — Other Ambulatory Visit: Payer: Self-pay | Admitting: Family Medicine

## 2023-04-05 ENCOUNTER — Encounter: Payer: Self-pay | Admitting: Family Medicine

## 2023-04-05 ENCOUNTER — Telehealth: Payer: Self-pay | Admitting: Obstetrics and Gynecology

## 2023-04-05 ENCOUNTER — Ambulatory Visit: Payer: BC Managed Care – PPO | Admitting: Family Medicine

## 2023-04-05 VITALS — BP 92/64 | HR 64 | Temp 98.2°F | Resp 16 | Ht 68.0 in | Wt 151.0 lb

## 2023-04-05 DIAGNOSIS — Z1159 Encounter for screening for other viral diseases: Secondary | ICD-10-CM | POA: Insufficient documentation

## 2023-04-05 DIAGNOSIS — E559 Vitamin D deficiency, unspecified: Secondary | ICD-10-CM | POA: Diagnosis not present

## 2023-04-05 DIAGNOSIS — N939 Abnormal uterine and vaginal bleeding, unspecified: Secondary | ICD-10-CM

## 2023-04-05 DIAGNOSIS — E538 Deficiency of other specified B group vitamins: Secondary | ICD-10-CM | POA: Insufficient documentation

## 2023-04-05 DIAGNOSIS — Z1329 Encounter for screening for other suspected endocrine disorder: Secondary | ICD-10-CM | POA: Insufficient documentation

## 2023-04-05 DIAGNOSIS — Z114 Encounter for screening for human immunodeficiency virus [HIV]: Secondary | ICD-10-CM | POA: Diagnosis not present

## 2023-04-05 DIAGNOSIS — J4520 Mild intermittent asthma, uncomplicated: Secondary | ICD-10-CM | POA: Diagnosis not present

## 2023-04-05 DIAGNOSIS — L2082 Flexural eczema: Secondary | ICD-10-CM

## 2023-04-05 LAB — VITAMIN B12: Vitamin B-12: 331 pg/mL (ref 211–911)

## 2023-04-05 LAB — TSH: TSH: 1.42 u[IU]/mL (ref 0.35–5.50)

## 2023-04-05 LAB — VITAMIN D 25 HYDROXY (VIT D DEFICIENCY, FRACTURES): VITD: 18.74 ng/mL — ABNORMAL LOW (ref 30.00–100.00)

## 2023-04-05 LAB — POCT URINE PREGNANCY: Preg Test, Ur: NEGATIVE

## 2023-04-05 MED ORDER — VITAMIN D (ERGOCALCIFEROL) 1.25 MG (50000 UNIT) PO CAPS: 50000.0000 [IU] | ORAL_CAPSULE | ORAL | 1 refills | Status: DC

## 2023-04-05 NOTE — Patient Instructions (Signed)
It was a pleasure meeting you today. Thank you for allowing me to take part in your health care.  Our goals for today as we discussed include:  We will get some labs today.  If they are abnormal or we need to do something about them, I will call you.  If they are normal, I will send you a message on MyChart (if it is active) or a letter in the mail.  If you don't hear from [place] in 2 weeks, please call the office at the number below.   Follow up with OBGYN for cervical cancer screening  This is a list of the screening recommended for you and due dates:  Health Maintenance  Topic Date Due   HIV Screening  Never done   Hepatitis C Screening  Never done   HPV Vaccine (2 - 3-dose series) 08/13/2018   COVID-19 Vaccine (1 - 2023-24 season) Never done   Pap Smear  03/17/2023   Flu Shot  09/10/2023*   Chlamydia screening  05/12/2023   DTaP/Tdap/Td vaccine (9 - Td or Tdap) 10/05/2030  *Topic was postponed. The date shown is not the original due date.     Follow up as needed  If you have any questions or concerns, please do not hesitate to call the office at [phone].  I look forward to our next visit and until then take care and stay safe.  Regards,   Dana Allan, MD   [facility]

## 2023-04-05 NOTE — Telephone Encounter (Signed)
LM for patient to call office back to schedule annual appt with [facility] after 05/12/23

## 2023-04-05 NOTE — Progress Notes (Signed)
SUBJECTIVE:   Chief Complaint  Patient presents with   Establish Care   HPI Presents to clinic to establish care  Discussed the use of AI scribe software for clinical note transcription with the patient, who gave verbal consent to proceed.  History of Present Illness The patient, Sandra Dennis, presents to establish primary care, having previously only seen a gynecologist. She reports a history of asthma, diagnosed at age 22 following an ER visit for severe breathlessness. The patient has not been hospitalized for asthma and does not regularly see a pulmonologist. She uses albuterol as needed, primarily when she is sick and experiencing difficulty breathing. She denies any exercise-induced asthma symptoms. The patient's mother and child also have asthma.  Sandra Dennis is currently on birth control, which she has been taking for a year. She reports recent irregularities in her menstrual cycle, having experienced four periods since the beginning of September. Each period lasted approximately three days. The patient admits to not taking her birth control pill at the same time every day and was recently on Bactrim for a rash on her leg, which she believes may have affected the efficacy of her birth control. She is sexually active with the same partner and does not use condoms. She follows with OBGYN for BCP management.  The patient also reports a history of throat surgery three years ago due to an abscess in her throat. She stayed in the hospital overnight following the procedure. She has one child, who is now 66 years old. The patient denies any history of high blood pressure, diabetes, heart problems, or thyroid problems. She does not smoke, vape, or use illicit drugs, but does drink alcohol occasionally on weekends.  The patient also has eczema, which she manages with over-the-counter eczema lotion. She denies any chest pain, shortness of breath, or swelling in her legs. She is currently employed at a  [employer].    PERTINENT PMH / PSH: As above  OBJECTIVE:  BP 92/64   Pulse 64   Temp 98.2 F (36.8 C)   Resp 16   Ht 5\' 8"  (1.727 m)   Wt 151 lb (68.5 kg)   LMP 03/19/2023   SpO2 99%   BMI 22.96 kg/m    Physical Exam Vitals reviewed.  Constitutional:      General: She is not in acute distress.    Appearance: She is not ill-appearing.  HENT:     Head: Normocephalic.     Right Ear: Tympanic membrane, ear canal and external ear normal.     Left Ear: Tympanic membrane, ear canal and external ear normal.     Nose: Nose normal.     Mouth/Throat:     Mouth: Mucous membranes are moist.  Eyes:     Extraocular Movements: Extraocular movements intact.     Conjunctiva/sclera: Conjunctivae normal.     Pupils: Pupils are equal, round, and reactive to light.  Neck:     Thyroid: No thyromegaly or thyroid tenderness.     Vascular: No carotid bruit.  Cardiovascular:     Rate and Rhythm: Normal rate and regular rhythm.     Pulses: Normal pulses.     Heart sounds: Normal heart sounds.  Pulmonary:     Effort: Pulmonary effort is normal.     Breath sounds: Normal breath sounds.  Abdominal:     General: Bowel sounds are normal. There is no distension.     Palpations: Abdomen is soft.     Tenderness: There is no abdominal  tenderness. There is no right CVA tenderness, left CVA tenderness, guarding or rebound.  Musculoskeletal:        General: Normal range of motion.     Cervical back: Normal range of motion.     Right lower leg: No edema.     Left lower leg: No edema.  Lymphadenopathy:     Cervical: No cervical adenopathy.  Skin:    Capillary Refill: Capillary refill takes less than 2 seconds.  Neurological:     General: No focal deficit present.     Mental Status: She is alert and oriented to person, place, and time. Mental status is at baseline.     Motor: No weakness.  Psychiatric:        Mood and Affect: Mood normal.        Behavior: Behavior normal.        Thought  Content: Thought content normal.        Judgment: Judgment normal.        04/05/2023   10:20 AM  Depression screen PHQ 2/9  Decreased Interest 1  Down, Depressed, Hopeless 1  PHQ - 2 Score 2  Altered sleeping 2  Tired, decreased energy 2  Change in appetite 0  Feeling bad or failure about yourself  0  Trouble concentrating 0  Moving slowly or fidgety/restless 0  Suicidal thoughts 0  PHQ-9 Score 6  Difficult doing work/chores Not difficult at all      04/05/2023   10:20 AM  GAD 7 : Generalized Anxiety Score  Nervous, Anxious, on Edge 1  Control/stop worrying 1  Worry too much - different things 2  Trouble relaxing 1  Restless 1  Afraid - awful might happen 0  Anxiety Difficulty Not difficult at all    ASSESSMENT/PLAN:  Abnormal uterine bleeding Assessment & Plan: Recent irregularities in menstrual cycle, possibly due to inconsistent use of birth control and recent antibiotic use. -Pregnancy test negative. -Advise consistent use of birth control pill. -Encouraged condom use to avoid unwanted pregnancy. -Discuss with OB/GYN about possible need to change birth control pill.   Vitamin D deficiency -     VITAMIN D 25 Hydroxy (Vit-D Deficiency, Fractures)  Vitamin B 12 deficiency -     Vitamin B12  Encounter for screening for HIV -     HIV Antibody (routine testing w rflx)  Need for hepatitis C screening test -     Hepatitis C antibody  Abnormal uterine bleeding (AUB) -     TSH -     POCT urine pregnancy  Mild intermittent asthma without complication Assessment & Plan: Diagnosed at age 48, no hospitalizations. Albuterol use is infrequent and primarily when ill. No pulmonary function tests performed. Family history of asthma. -Consider referral for pulmonary function tests if frequency of asthmatic attacks increases. -Consider changing to Airsupra if reactivity increases.   Flexural eczema Assessment & Plan: Localized to arms, managed with  over-the-counter eczema lotion. -Continue current management. -Consider use of over-the-counter hydrocortisone if needed.    General Health Maintenance -Advise tetanus vaccine next year. -Consider HPV vaccine if not previously administered -Declined flu vaccine.  -PAP scheduled with OBGYN in Nov.  Request that she have records sent at that time.    PDMP reviewed  Return if symptoms worsen or fail to improve, for PCP.  Dana Allan, MD

## 2023-04-06 LAB — HIV ANTIBODY (ROUTINE TESTING W REFLEX): HIV 1&2 Ab, 4th Generation: NONREACTIVE

## 2023-04-06 LAB — HEPATITIS C ANTIBODY: Hepatitis C Ab: NONREACTIVE

## 2023-04-08 ENCOUNTER — Encounter: Payer: Self-pay | Admitting: Family Medicine

## 2023-04-08 DIAGNOSIS — J45909 Unspecified asthma, uncomplicated: Secondary | ICD-10-CM | POA: Insufficient documentation

## 2023-04-08 DIAGNOSIS — L309 Dermatitis, unspecified: Secondary | ICD-10-CM | POA: Insufficient documentation

## 2023-04-08 NOTE — Assessment & Plan Note (Signed)
Localized to arms, managed with over-the-counter eczema lotion. -Continue current management. -Consider use of over-the-counter hydrocortisone if needed.

## 2023-04-08 NOTE — Assessment & Plan Note (Signed)
Recent irregularities in menstrual cycle, possibly due to inconsistent use of birth control and recent antibiotic use. -Pregnancy test negative. -Advise consistent use of birth control pill. -Encouraged condom use to avoid unwanted pregnancy. -Discuss with OB/GYN about possible need to change birth control pill.

## 2023-04-08 NOTE — Assessment & Plan Note (Signed)
Diagnosed at age 22, no hospitalizations. Albuterol use is infrequent and primarily when ill. No pulmonary function tests performed. Family history of asthma. -Consider referral for pulmonary function tests if frequency of asthmatic attacks increases. -Consider changing to Airsupra if reactivity increases.

## 2023-05-14 NOTE — Progress Notes (Unsigned)
PCP:  Dana Allan, MD   No chief complaint on file.    HPI:      [name] is a 22 y.o. G1P1001 whose LMP was No LMP recorded. (Menstrual status: Oral contraceptives)., presents today for her annual examination.  Her menses are regular every 28-30 days, lasting 5 days.  Dysmenorrhea mild, occurring first 1-2 days of flow. She does not have intermenstrual bleeding.  Sex activity: single partner, contraception - OCP (estrogen/progesterone). Nexplanon removed 03/16/20, started on OCPs. No pain/bleeding.  Last Pap: 03/16/20 Results were: no abnormalities  Hx of STDs: chlamydia in past; trich 2023 with neg TOC 9/23  There is no FH of breast cancer. There is no FH of ovarian cancer. The patient does not do self-breast exams.  Tobacco use: The patient denies current or previous tobacco use. Alcohol use: social drinker No drug use.  Exercise: moderately active  She does get adequate calcium and Vitamin D in her diet. Unsure if [place] done.  Patient Active Problem List   Diagnosis Date Noted   Asthma 04/08/2023   Eczema 04/08/2023   Abnormal uterine bleeding (AUB) 04/05/2023   Need for hepatitis C screening test 04/05/2023   Encounter for screening for HIV 04/05/2023   Thyroid disorder screening 04/05/2023   Vitamin B 12 deficiency 04/05/2023   Vitamin D deficiency 04/05/2023   Chlamydia 05/10/2021   Peritonsillar abscess 01/24/2020   Dizziness 07/16/2018   Healthcare maintenance 07/16/2018   Screen for STD (sexually transmitted disease) 04/13/2016   Slow transit constipation 04/13/2016   Abnormal uterine bleeding 04/12/2016    Past Surgical History:  Procedure Laterality Date   INCISION AND DRAINAGE OF PERITONSILLAR ABCESS N/A 01/24/2020   Procedure: INCISION AND DRAINAGE OF PERITONSILLAR ABCESS;  Surgeon: Vernie Murders, MD;  Location: [facility] ORS;  Service: ENT;  Laterality: N/A;   THROAT SURGERY      Family History  Problem Relation Age of Onset   Breast cancer  Neg Hx    Ovarian cancer Neg Hx     Social History   Socioeconomic History   Marital status: Single    Spouse name: Not on file   Number of children: Not on file   Years of education: Not on file   Highest education level: Some college, no degree  Occupational History   Not on file  Tobacco Use   Smoking status: Never   Smokeless tobacco: Never  Vaping Use   Vaping status: Never Used  Substance and Sexual Activity   Alcohol use: Not Currently    Comment: socially   Drug use: Never   Sexual activity: Yes    Birth control/protection: Pill  Other Topics Concern   Not on file  Social History Narrative   Not on file   Social Determinants of Health   Financial Resource Strain: Low Risk  (03/30/2023)   Overall Financial Resource Strain (CARDIA)    Difficulty of Paying Living Expenses: Not hard at all  Food Insecurity: No Food Insecurity (03/30/2023)   Hunger Vital Sign    Worried About Radiation protection practitioner of Food in the Last Year: Never true    Ran Out of Food in the Last Year: Never true  Transportation Needs: No Transportation Needs (03/30/2023)   PRAPARE - Administrator, Civil Service (Medical): No    Lack of Transportation (Non-Medical): No  Physical Activity: Unknown (03/30/2023)   Exercise Vital Sign    Days of Exercise per Week: 0 days    Minutes  of Exercise per Session: Not on file  Stress: Stress Concern Present (03/30/2023)   [employer] of Occupational Health - Occupational Stress Questionnaire    Feeling of Stress : Rather much  Social Connections: Socially Isolated (03/30/2023)   Social Connection and Isolation Panel [NHANES]    Frequency of Communication with Friends and Family: Three times a week    Frequency of Social Gatherings with Friends and Family: Twice a week    Attends Religious Services: Never    Database administrator or Organizations: No    Attends Engineer, structural: Not on file    Marital Status: Never married   Catering manager Violence: Not on file     Current Outpatient Medications:    albuterol (PROVENTIL HFA;VENTOLIN HFA) 108 (90 Base) MCG/ACT inhaler, Inhale 2 puffs into the lungs every 6 (six) hours as needed for wheezing or shortness of breath., Disp: 1 Inhaler, Rfl: 2   norethindrone-ethinyl estradiol-FE (JUNEL FE 1/20) 1-20 MG-MCG tablet, Take 1 tablet by mouth daily., Disp: 84 tablet, Rfl: 1   Vitamin D, Ergocalciferol, (DRISDOL) 1.25 MG (50000 UNIT) CAPS capsule, Take 1 capsule (50,000 Units total) by mouth every 7 (seven) days., Disp: 12 capsule, Rfl: 1     ROS:  Review of Systems  Constitutional:  Negative for fatigue, fever and unexpected weight change.  Respiratory:  Negative for cough, shortness of breath and wheezing.   Cardiovascular:  Negative for chest pain, palpitations and leg swelling.  Gastrointestinal:  Negative for blood in stool, constipation, diarrhea, nausea and vomiting.  Endocrine: Negative for cold intolerance, heat intolerance and polyuria.  Genitourinary:  Negative for dyspareunia, dysuria, flank pain, frequency, genital sores, hematuria, menstrual problem, pelvic pain, urgency, vaginal bleeding, vaginal discharge and vaginal pain.  Musculoskeletal:  Positive for arthralgias. Negative for back pain, joint swelling and myalgias.  Skin:  Negative for rash.  Neurological:  Negative for dizziness, syncope, light-headedness, numbness and headaches.  Hematological:  Negative for adenopathy.  Psychiatric/Behavioral:  Negative for agitation, confusion, sleep disturbance and suicidal ideas. The patient is not nervous/anxious.    BREAST: No symptoms   Objective: There were no vitals taken for this visit.   Physical Exam Constitutional:      Appearance: She is well-developed.  Genitourinary:     Vulva normal.     Right Labia: No rash, tenderness or lesions.    Left Labia: No tenderness, lesions or rash.    No vaginal discharge, erythema or tenderness.       Right Adnexa: not tender and no mass present.    Left Adnexa: not tender and no mass present.    No cervical friability or polyp.     Uterus is not enlarged or tender.  Breasts:    Right: No mass, nipple discharge, skin change or tenderness.     Left: No mass, nipple discharge, skin change or tenderness.  Neck:     Thyroid: No thyromegaly.  Cardiovascular:     Rate and Rhythm: Normal rate and regular rhythm.     Heart sounds: Normal heart sounds. No murmur heard. Pulmonary:     Effort: Pulmonary effort is normal.     Breath sounds: Normal breath sounds.  Abdominal:     Palpations: Abdomen is soft.     Tenderness: There is no abdominal tenderness. There is no guarding or rebound.  Musculoskeletal:        General: Normal range of motion.     Cervical back: Normal range of motion.  Lymphadenopathy:  Cervical: No cervical adenopathy.  Neurological:     General: No focal deficit present.     Mental Status: She is alert and oriented to person, place, and time.     Cranial Nerves: No cranial nerve deficit.  Skin:    General: Skin is warm and dry.  Psychiatric:        Mood and Affect: Mood normal.        Behavior: Behavior normal.        Thought Content: Thought content normal.        Judgment: Judgment normal.  Vitals reviewed.     Assessment/Plan: Encounter for annual routine gynecological examination  Screening for STD (sexually transmitted disease) - Plan: Cervicovaginal ancillary only  Encounter for surveillance of contraceptive pills - Plan: norethindrone-ethinyl estradiol-FE (JUNEL FE 1/20) 1-20 MG-MCG tablet; OCP RF.    No orders of the defined types were placed in this encounter.             GYN counsel adequate intake of calcium and vitamin D, diet and exercise; gardasil discussed--pt to check with parents if done     F/U  No follow-ups on file.  Myron Lona B. Makenzye Troutman, PA-C 05/14/2023 3:06 PM

## 2023-05-15 ENCOUNTER — Ambulatory Visit: Payer: BC Managed Care – PPO | Admitting: Obstetrics and Gynecology

## 2023-05-15 DIAGNOSIS — Z3041 Encounter for surveillance of contraceptive pills: Secondary | ICD-10-CM

## 2023-05-15 DIAGNOSIS — Z113 Encounter for screening for infections with a predominantly sexual mode of transmission: Secondary | ICD-10-CM

## 2023-05-15 DIAGNOSIS — Z124 Encounter for screening for malignant neoplasm of cervix: Secondary | ICD-10-CM

## 2023-05-15 DIAGNOSIS — Z01419 Encounter for gynecological examination (general) (routine) without abnormal findings: Secondary | ICD-10-CM

## 2023-05-16 ENCOUNTER — Encounter: Payer: Self-pay | Admitting: Certified Nurse Midwife

## 2023-05-16 ENCOUNTER — Other Ambulatory Visit (HOSPITAL_COMMUNITY)
Admission: RE | Admit: 2023-05-16 | Discharge: 2023-05-16 | Disposition: A | Discharge status: Home / Self Care | Payer: BC Managed Care – PPO | Source: Ambulatory Visit | Attending: Certified Nurse Midwife | Admitting: Certified Nurse Midwife

## 2023-05-16 ENCOUNTER — Ambulatory Visit (INDEPENDENT_AMBULATORY_CARE_PROVIDER_SITE_OTHER): Payer: BC Managed Care – PPO | Admitting: Certified Nurse Midwife

## 2023-05-16 VITALS — BP 98/66 | HR 66 | Ht 68.0 in | Wt 155.0 lb

## 2023-05-16 DIAGNOSIS — Z124 Encounter for screening for malignant neoplasm of cervix: Secondary | ICD-10-CM | POA: Diagnosis not present

## 2023-05-16 DIAGNOSIS — Z23 Encounter for immunization: Secondary | ICD-10-CM

## 2023-05-16 DIAGNOSIS — Z113 Encounter for screening for infections with a predominantly sexual mode of transmission: Secondary | ICD-10-CM | POA: Diagnosis not present

## 2023-05-16 DIAGNOSIS — Z01419 Encounter for gynecological examination (general) (routine) without abnormal findings: Secondary | ICD-10-CM | POA: Diagnosis not present

## 2023-05-16 DIAGNOSIS — B9689 Other specified bacterial agents as the cause of diseases classified elsewhere: Secondary | ICD-10-CM | POA: Diagnosis not present

## 2023-05-16 DIAGNOSIS — N760 Acute vaginitis: Secondary | ICD-10-CM | POA: Insufficient documentation

## 2023-05-16 DIAGNOSIS — Z114 Encounter for screening for human immunodeficiency virus [HIV]: Secondary | ICD-10-CM

## 2023-05-16 MED ORDER — NORELGESTROMIN-ETH ESTRADIOL 150-35 MCG/24HR TD PTWK: 1.0000 | MEDICATED_PATCH | TRANSDERMAL | 4 refills | Status: DC

## 2023-05-16 NOTE — Progress Notes (Signed)
ANNUAL EXAM Patient name: Sandra Dennis MRN [id number]  Date of birth: 01/24/2001 Chief Complaint:   Gynecologic Exam (No concerns)  History of Present Illness:   Sandra Dennis is a 22 y.o. G59P1001 African-American female being seen today for a routine annual exam.  Current complaints: interested in other options from pill, desires STI screening. Received one dose gardasil when younger, desires to restart series. Monogamous with female partner, concerns about fidelity.  Naval architect, active at work.   Patient's last menstrual period was 04/30/2023 (exact date).   The pregnancy intention screening data noted above was reviewed. Potential methods of contraception were discussed. The patient elected to proceed with Female Condom; Contraceptive Patch.      Component Value Date/Time   DIAGPAP  03/16/2020 1204    - Negative for intraepithelial lesion or malignancy (NILM)   ADEQPAP  03/16/2020 1204    Satisfactory for evaluation; transformation zone component PRESENT.       Last pap 2021. Results were: NILM w/ HRHPV not done. H/O abnormal pap: no Last mammogram: n/a. Results were: N/A. Family h/o breast cancer: no Last colonoscopy: n/a. Results were: N/A. Family h/o colorectal cancer: no     05/16/2023    9:24 AM 04/05/2023   10:20 AM  Depression screen PHQ 2/9  Decreased Interest 0 1  Down, Depressed, Hopeless 0 1  PHQ - 2 Score 0 2  Altered sleeping  2  Tired, decreased energy  2  Change in appetite  0  Feeling bad or failure about yourself   0  Trouble concentrating  0  Moving slowly or fidgety/restless  0  Suicidal thoughts  0  PHQ-9 Score  6  Difficult doing work/chores  Not difficult at all        04/05/2023   10:20 AM  GAD 7 : Generalized Anxiety Score  Nervous, Anxious, on Edge 1  Control/stop worrying 1  Worry too much - different things 2  Trouble relaxing 1  Restless 1  Afraid - awful might happen 0  Anxiety Difficulty Not difficult at all      Review of Systems:   Pertinent items are noted in HPI Denies any headaches, blurred vision, fatigue, shortness of breath, chest pain, abdominal pain, abnormal vaginal discharge/itching/odor/irritation, problems with periods, bowel movements, urination, or intercourse unless otherwise stated above. Pertinent History Reviewed:  Reviewed past medical,surgical, social and family history.  Reviewed problem list, medications and allergies. Physical Assessment:   Vitals:   05/16/23 0850 05/16/23 0928  BP: (!) 116/103 98/66  Pulse: 68 66  Weight: 155 lb (70.3 kg)   Height: 5\' 8"  (1.727 m)   Body mass index is 23.57 kg/m.       Physical Exam Vitals reviewed.  Constitutional:      General: She is not in acute distress.    Appearance: Normal appearance.  HENT:     Head: Normocephalic.  Neck:     Thyroid: No thyroid mass or thyromegaly.  Cardiovascular:     Rate and Rhythm: Normal rate and regular rhythm.     Heart sounds: Normal heart sounds.  Pulmonary:     Effort: Pulmonary effort is normal.     Breath sounds: Normal breath sounds.  Chest:  Breasts:    Tanner Score is 5.     Right: Normal.     Left: Normal.  Abdominal:     General: Abdomen is flat.     Palpations: Abdomen is soft.     Tenderness: There  is no abdominal tenderness.  Genitourinary:    General: Normal vulva.     Vagina: Normal.     Cervix: Normal.     Uterus: Not enlarged, not fixed and not tender.      Adnexa:        Right: No mass or tenderness.         Left: No mass or tenderness.    Musculoskeletal:     Cervical back: Neck supple. No tenderness.  Skin:    General: Skin is warm and dry.  Neurological:     General: No focal deficit present.     Mental Status: She is alert and oriented to person, place, and time.  Psychiatric:        Mood and Affect: Mood normal.        Behavior: Behavior normal.      No results found for this or any previous visit (from the past 24 hour(s)).  Assessment &  Plan:  1. Encounter for annual routine gynecological examination - Hepatitis B surface antigen - Hepatitis C antibody - HIV Antibody (routine testing w rflx) - RPR - Cervicovaginal ancillary only - Cytology - PAP  2. Screening for human immunodeficiency virus - HIV Antibody (routine testing w rflx)  3. Cervical cancer screening - Cytology - PAP  4. Screen for sexually transmitted diseases - Hepatitis B surface antigen - Hepatitis C antibody - RPR - Cervicovaginal ancillary only  5. Encounter for immunization  Begin patch when next pill pack due, if unhappy with patch contact provider by MyChart to restart pill. Continue condoms for STI prevention.  Mammogram: @ 24yo, or sooner if problems Colonoscopy: @ 24yo, or sooner if problems  Orders Placed This Encounter  Procedures   HPV 9-valent vaccine,Recombinat   Hepatitis B surface antigen   Hepatitis C antibody   HIV Antibody (routine testing w rflx)   RPR    Meds:  Meds ordered this encounter  Medications   norelgestromin-ethinyl estradiol [facility]ico) 150-35 MCG/24HR transdermal patch    Sig: Place 1 patch onto the skin once a week. For three weeks, remain patch free on the fourth week.    Dispense:  9 patch    Refill:  4    Order Specific Question:   Supervising Provider    Answer:   Hildred Laser [[id number]]    Follow-up: Return in 1 year (on 05/15/2024) for Annual exam; Gardasil #2 in 1-2 months, #3 in 6 months.  Dominica Severin, CNM 05/16/2023 9:57 AM

## 2023-05-17 LAB — HEPATITIS B SURFACE ANTIGEN: Hepatitis B Surface Ag: NEGATIVE

## 2023-05-17 LAB — HEPATITIS C ANTIBODY: Hep C Virus Ab: NONREACTIVE

## 2023-05-17 LAB — HIV ANTIBODY (ROUTINE TESTING W REFLEX): HIV Screen 4th Generation wRfx: NONREACTIVE

## 2023-05-17 LAB — RPR: RPR Ser Ql: NONREACTIVE

## 2023-05-18 ENCOUNTER — Other Ambulatory Visit: Payer: Self-pay | Admitting: Certified Nurse Midwife

## 2023-05-18 LAB — CERVICOVAGINAL ANCILLARY ONLY
Bacterial Vaginitis (gardnerella): POSITIVE — AB
Candida Glabrata: NEGATIVE
Candida Vaginitis: NEGATIVE
Chlamydia: NEGATIVE
Comment: NEGATIVE
Comment: NEGATIVE
Comment: NEGATIVE
Comment: NEGATIVE
Comment: NEGATIVE
Comment: NORMAL
Neisseria Gonorrhea: NEGATIVE
Trichomonas: NEGATIVE

## 2023-05-18 LAB — CYTOLOGY - PAP: Diagnosis: NEGATIVE

## 2023-05-18 MED ORDER — METRONIDAZOLE 500 MG PO TABS
500.0000 mg | ORAL_TABLET | Freq: Two times a day (BID) | ORAL | 0 refills | Status: AC
Start: 2023-05-18 — End: 2023-05-25

## 2023-06-17 ENCOUNTER — Encounter: Payer: Self-pay | Admitting: Certified Nurse Midwife

## 2023-06-18 MED ORDER — NORETHIN ACE-ETH ESTRAD-FE 1-20 MG-MCG PO TABS: 1.0000 | ORAL_TABLET | Freq: Every day | ORAL | 3 refills | Status: DC

## 2023-06-18 NOTE — Telephone Encounter (Signed)
 I sent in prescription of Junel.

## 2023-07-01 ENCOUNTER — Encounter: Payer: Self-pay | Admitting: Emergency Medicine

## 2023-07-01 ENCOUNTER — Emergency Department
Admission: EM | Admit: 2023-07-01 | Discharge: 2023-07-01 | Disposition: A | Discharge status: Home / Self Care | Payer: BC Managed Care – PPO | Attending: Emergency Medicine | Admitting: Emergency Medicine

## 2023-07-01 ENCOUNTER — Emergency Department: Payer: BC Managed Care – PPO

## 2023-07-01 ENCOUNTER — Other Ambulatory Visit: Payer: Self-pay

## 2023-07-01 DIAGNOSIS — S92101A Unspecified fracture of right talus, initial encounter for closed fracture: Secondary | ICD-10-CM | POA: Diagnosis not present

## 2023-07-01 DIAGNOSIS — W108XXA Fall (on) (from) other stairs and steps, initial encounter: Secondary | ICD-10-CM | POA: Insufficient documentation

## 2023-07-01 DIAGNOSIS — M25571 Pain in right ankle and joints of right foot: Secondary | ICD-10-CM | POA: Diagnosis not present

## 2023-07-01 DIAGNOSIS — S92151A Displaced avulsion fracture (chip fracture) of right talus, initial encounter for closed fracture: Secondary | ICD-10-CM | POA: Insufficient documentation

## 2023-07-01 DIAGNOSIS — W19XXXA Unspecified fall, initial encounter: Secondary | ICD-10-CM

## 2023-07-01 MED ORDER — KETOROLAC TROMETHAMINE 10 MG PO TABS
10.0000 mg | ORAL_TABLET | Freq: Four times a day (QID) | ORAL | 0 refills | Status: AC | PRN
Start: 2023-07-01 — End: 2023-07-06

## 2023-07-01 MED ORDER — HYDROCODONE-ACETAMINOPHEN 5-325 MG PO TABS
1.0000 | ORAL_TABLET | Freq: Once | ORAL | Status: AC
  Administered 2023-07-01: 1 via ORAL
  Filled 2023-07-01: qty 1

## 2023-07-01 NOTE — ED Triage Notes (Addendum)
Pt sts that she was walking down some stairs and twisted her right ankle. Pt ambulatory with even and steady gait.

## 2023-07-01 NOTE — Discharge Instructions (Addendum)
You have been diagnosed with tarsal dorsal avulsion fracture. Please make an appointment with orthopedics for a follow up in a week. Please take ketorolac every six hours after meals. Do not submerge left leg in water.

## 2023-07-01 NOTE — ED Provider Notes (Signed)
[facility] Provider Note    Event Date/Time   First MD Initiated Contact with Patient 07/01/23 1350     (approximate)   History   Fall   HPI  Sandra Dennis is a 23 y.o. female  with history of fall at 12 am this morning,patient states can bear weight. Patient took ibuprofen at 3 am.       Physical Exam   Triage Vital Signs: ED Triage Vitals  Encounter Vitals Group     BP 07/01/23 1207 127/75     Systolic BP Percentile --      Diastolic BP Percentile --      Pulse Rate 07/01/23 1207 69     Resp 07/01/23 1207 17     Temp 07/01/23 1207 97.7 F (36.5 C)     Temp Source 07/01/23 1207 Oral     SpO2 07/01/23 1207 98 %     Weight 07/01/23 1208 155 lb (70.3 kg)     Height 07/01/23 1208 5\' 8"  (1.727 m)     Head Circumference --      Peak Flow --      Pain Score 07/01/23 1208 0     Pain Loc --      Pain Education --      Exclude from Growth Chart --     Most recent vital signs: Vitals:   07/01/23 1207  BP: 127/75  Pulse: 69  Resp: 17  Temp: 97.7 F (36.5 C)  SpO2: 98%     Constitutional: Alert non distress Eyes: Conjunctivae are normal.  Head: Atraumatic. Nose: No congestion/rhinnorhea. Mouth/Throat: Mucous membranes are moist.   Neck: Painless ROM.  Cardiovascular:   Good peripheral circulation. Respiratory: Normal respiratory effort.  No retractions.  Gastrointestinal: Soft and nontender.  Musculoskeletal:  no deformity RLE: Skin intact, no scars. Edema in lateral ankle, no bruises or hematomas. Sensitivity decreased, strength 3/5, pulses positive. Tender to palpation at lateral ankle.  Neurologic:  MAE spontaneously. No gross focal neurologic deficits are appreciated.  Skin:  Skin is warm, dry and intact. No rash noted. Psychiatric: Mood and affect are normal. Speech and behavior are normal.    ED Results / Procedures / Treatments   Labs (all labs ordered are listed, but only abnormal results are displayed) Labs  Reviewed - No data to display   EKG     RADIOLOGY I independently reviewed and interpreted imaging and agree with radiologists findings.      PROCEDURES:  Critical Care performed:   Procedures   MEDICATIONS ORDERED IN ED: Medications  HYDROcodone-acetaminophen (NORCO/VICODIN) 5-325 MG per tablet 1 tablet (1 tablet Oral Given 07/01/23 1504)     IMPRESSION / MDM / ASSESSMENT AND PLAN / ED COURSE  I reviewed the triage vital signs and the nursing notes.  Differential diagnosis includes, but is not limited to, tibial fracture, muscle strain talus fracture.   Patient's presentation is most consistent with acute complicated illness / injury requiring diagnostic workup.  Patient's diagnosis is consistent with talus dorsal avulsion fracture. I independently reviewed and interpreted imaging and agree with radiologists findings. I did review the patient's allergies and medications. Patient will be discharged home with prescriptions for ketorolac. Patient is to follow up with orthopedics as needed or otherwise directed. Patient is given ED precautions to return to the ED for any worsening or new symptoms. Discussed plan of care with patient, answered all of patient's questions, Patient agreeable to plan of care. Advised patient to take  medications according to the instructions on the label. Discussed possible side effects of new medications. Patient verbalized understanding.     FINAL CLINICAL IMPRESSION(S) / ED DIAGNOSES   Final diagnoses:  Fall, initial encounter  Closed displaced avulsion fracture of right talus, initial encounter     Rx / DC Orders   ED Discharge Orders          Ordered    ketorolac (TORADOL) 10 MG tablet  Every 6 hours PRN       Note to Pharmacy: Patient given an IM/IV loading dose in emergency department   07/01/23 1507             Note:  This document was prepared using Dragon voice recognition software and may include unintentional dictation  errors.   Gladys Damme, PA-C 07/01/23 1507    Janith Lima, MD 07/01/23 Ebony Cargo

## 2023-07-01 NOTE — ED Notes (Signed)
See triage note  Presents s/p fall  States she twisted her right ankle  Min swelling noted  No deformity noted  Good pulses  Able to bear wt

## 2023-07-05 DIAGNOSIS — S92101A Unspecified fracture of right talus, initial encounter for closed fracture: Secondary | ICD-10-CM | POA: Diagnosis not present

## 2023-07-17 ENCOUNTER — Ambulatory Visit: Payer: BC Managed Care – PPO

## 2023-07-18 ENCOUNTER — Ambulatory Visit (INDEPENDENT_AMBULATORY_CARE_PROVIDER_SITE_OTHER): Payer: BC Managed Care – PPO

## 2023-07-18 VITALS — BP 114/79 | HR 56 | Ht 69.0 in | Wt 159.0 lb

## 2023-07-18 DIAGNOSIS — S92101A Unspecified fracture of right talus, initial encounter for closed fracture: Secondary | ICD-10-CM | POA: Diagnosis not present

## 2023-07-18 DIAGNOSIS — Z23 Encounter for immunization: Secondary | ICD-10-CM | POA: Diagnosis not present

## 2023-07-18 NOTE — Progress Notes (Signed)
    NURSE VISIT NOTE  Subjective:    Patient ID: Sandra Dennis, female    DOB: 2000/11/15, 23 y.o.   MRN: [id number]  HPI  Patient is a 23 y.o. G40P1001 female who presents for her second Gardasil injection. Order to administer given by Harlene Cisco, CNM on 05/17/23.   Objective:    BP 114/79   Pulse (!) 56   Ht 5' 9 (1.753 m)   Wt 159 lb (72.1 kg)   BMI 23.48 kg/m    Given by: Annalee Sanders, CMA Site:  right deltoid   Assessment:   1. Need for HPV vaccination      Plan:   Patient will return in 4 months for injection.    Mily Malecki H Chivon Lepage, CMA

## 2023-10-17 ENCOUNTER — Telehealth: Payer: Self-pay | Admitting: Family Medicine

## 2023-10-17 NOTE — Telephone Encounter (Signed)
 Good afternoon,  Dr Sueanne Emerald will not be in the office for your scheduled visit tomorrow, 10/18/23. Please call the office at [phone] or schedule via MyChart. We also left a voicemail for you.  Thank you\  E2C2, please reschedule this pt's office visit if they call back. [facility]

## 2023-10-17 NOTE — Telephone Encounter (Signed)
 Your provider will not be in the tomorrow 10/18/23. Please call the office to reschedule @ [phone].

## 2023-10-18 ENCOUNTER — Ambulatory Visit: Admitting: Family Medicine

## 2023-10-18 ENCOUNTER — Encounter (HOSPITAL_COMMUNITY): Payer: Self-pay

## 2023-10-25 ENCOUNTER — Ambulatory Visit: Admitting: Family Medicine

## 2023-10-26 ENCOUNTER — Ambulatory Visit: Admitting: Family Medicine

## 2023-10-29 ENCOUNTER — Emergency Department
Admission: EM | Admit: 2023-10-29 | Discharge: 2023-10-29 | Disposition: A | Discharge status: Home / Self Care | Attending: Emergency Medicine | Admitting: Emergency Medicine

## 2023-10-29 ENCOUNTER — Encounter: Payer: Self-pay | Admitting: Intensive Care

## 2023-10-29 ENCOUNTER — Other Ambulatory Visit: Payer: Self-pay

## 2023-10-29 DIAGNOSIS — J020 Streptococcal pharyngitis: Secondary | ICD-10-CM | POA: Diagnosis not present

## 2023-10-29 DIAGNOSIS — J029 Acute pharyngitis, unspecified: Secondary | ICD-10-CM | POA: Diagnosis not present

## 2023-10-29 LAB — GROUP A STREP BY PCR: Group A Strep by PCR: DETECTED — AB

## 2023-10-29 MED ORDER — PREDNISONE 20 MG PO TABS
60.0000 mg | ORAL_TABLET | Freq: Once | ORAL | Status: AC
  Administered 2023-10-29: 60 mg via ORAL
  Filled 2023-10-29: qty 3

## 2023-10-29 MED ORDER — AMOXICILLIN 500 MG PO CAPS: 1000.0000 mg | ORAL_CAPSULE | Freq: Every day | ORAL | 0 refills | Status: AC

## 2023-10-29 MED ORDER — AMOXICILLIN 500 MG PO CAPS
1000.0000 mg | ORAL_CAPSULE | Freq: Once | ORAL | Status: AC
  Administered 2023-10-29: 1000 mg via ORAL
  Filled 2023-10-29: qty 2

## 2023-10-29 NOTE — ED Provider Notes (Signed)
 [facility] Provider Note    Event Date/Time   First MD Initiated Contact with Patient 10/29/23 1747     (approximate)   History   Sore Throat   HPI Sandra Dennis is a 23 y.o. female presenting today for sore throat.  Symptoms present for the past 3 days.  Painful to swallow but no difficulty with breathing.  Denies any fevers.  Mild nausea but no vomiting.  Denies any chest pain or shortness of breath.     Physical Exam   Triage Vital Signs: ED Triage Vitals  Encounter Vitals Group     BP 10/29/23 1615 127/64     Systolic BP Percentile --      Diastolic BP Percentile --      Pulse Rate 10/29/23 1615 76     Resp 10/29/23 1615 16     Temp 10/29/23 1615 97.9 F (36.6 C)     Temp Source 10/29/23 1615 Oral     SpO2 10/29/23 1615 100 %     Weight 10/29/23 1614 155 lb (70.3 kg)     Height 10/29/23 1614 5\' 8"  (1.727 m)     Head Circumference --      Peak Flow --      Pain Score 10/29/23 1614 6     Pain Loc --      Pain Education --      Exclude from Growth Chart --     Most recent vital signs: Vitals:   10/29/23 1615  BP: 127/64  Pulse: 76  Resp: 16  Temp: 97.9 F (36.6 C)  SpO2: 100%   I have reviewed the vital signs. General:  Awake, alert, no acute distress. Head:  Normocephalic, Atraumatic. EENT:  PERRL, EOMI, Oral mucosa pink and moist, Neck is supple.  Notable erythema and tonsillar swelling with exudates present.  No uvular deviation.  Airway is patent. Cardiovascular: Regular rate, 2+ distal pulses. Respiratory:  Normal respiratory effort, symmetrical expansion, no distress.   Extremities:  Moving all four extremities through full ROM without pain.   Neuro:  Alert and oriented.  Interacting appropriately.   Skin:  Warm, dry, no rash.   Psych: Appropriate affect.    ED Results / Procedures / Treatments   Labs (all labs ordered are listed, but only abnormal results are displayed) Labs Reviewed  GROUP A STREP BY PCR -  Abnormal; Notable for the following components:      Result Value   Group A Strep by PCR DETECTED (*)    All other components within normal limits     EKG    RADIOLOGY    PROCEDURES:  Critical Care performed: No  Procedures   MEDICATIONS ORDERED IN ED: Medications  predniSONE  (DELTASONE ) tablet 60 mg (60 mg Oral Given 10/29/23 1806)  amoxicillin  (AMOXIL ) capsule 1,000 mg (1,000 mg Oral Given 10/29/23 1806)     IMPRESSION / MDM / ASSESSMENT AND PLAN / ED COURSE  I reviewed the triage vital signs and the nursing notes.                              Differential diagnosis includes, but is not limited to, strep pharyngitis, viral pharyngitis  Patient's presentation is most consistent with acute complicated illness / injury requiring diagnostic workup.  Patient is a 23 year old female presenting today for sore throat.  Erythema and tonsillar swelling present with exudates.  Patient does have positive for strep  throat.  No uvular deviation indicative of peritonsillar abscess.  Patient otherwise will be started on amoxicillin .  Given one-time dose of prednisone  to help with swelling around the throat and ease pain symptoms.  Otherwise safe for discharge and given strict return precautions.  Provided with the number for ENT as she states she has recurrent issues with strep throat and viral infections.     FINAL CLINICAL IMPRESSION(S) / ED DIAGNOSES   Final diagnoses:  Strep pharyngitis     Rx / DC Orders   ED Discharge Orders          Ordered    amoxicillin  (AMOXIL ) 500 MG capsule  Daily        10/29/23 1804             Note:  This document was prepared using Dragon voice recognition software and may include unintentional dictation errors.   Kandee Orion, MD 10/29/23 [phone]

## 2023-10-29 NOTE — ED Triage Notes (Signed)
 Patient c/o sore throat for three days. Pain when swallowing   Denies fevers

## 2023-10-29 NOTE — Discharge Instructions (Addendum)
 I have sent antibiotics to your pharmacy to help treat your strep throat.  I have also provided the number to an ENT doctor in the area.  You can follow-up with them as needed if you want to discuss getting her tonsils removed.

## 2023-10-29 NOTE — ED Provider Triage Note (Signed)
 Emergency Medicine Provider Triage Evaluation Note  Sandra Dennis , a 23 y.o. female  was evaluated in triage.  Pt complains of sore throat x 3 days progressively worsening. 1 episode of vomiting yesterday.  Review of Systems  Positive:  Negative: fever  Physical Exam  Ht 5\' 8"  (1.727 m)   Wt 70.3 kg   BMI 23.57 kg/m  Gen:   Awake, no distress   Resp:  Normal effort  MSK:   Moves extremities without difficulty  Other:  Bilateral exudates noted on tonsils. Mild erythema.   Medical Decision Making  Medically screening exam initiated at 4:15 PM.  Appropriate orders placed.  LANA FLAIM was informed that the remainder of the evaluation will be completed by another provider, this initial triage assessment does not replace that evaluation, and the importance of remaining in the ED until their evaluation is complete.    Phyllis Breeze, Savina Olshefski A, PA-C 10/29/23 1617

## 2023-10-29 NOTE — ED Notes (Signed)
 See triage notes. Patient c/o sore throat times three days. Patient has pain when swallowing.

## 2023-10-30 ENCOUNTER — Ambulatory Visit: Admitting: Family Medicine

## 2023-11-14 ENCOUNTER — Ambulatory Visit: Payer: BC Managed Care – PPO

## 2023-11-14 DIAGNOSIS — Z23 Encounter for immunization: Secondary | ICD-10-CM

## 2023-11-20 ENCOUNTER — Ambulatory Visit

## 2023-11-20 NOTE — Progress Notes (Deleted)
    NURSE VISIT NOTE  Subjective:    Patient ID: Sandra Dennis, female    DOB: [birth year], 23 y.o.   MRN: [id number]  HPI  Patient is a 23 y.o. G2P1001  female who presents for her third Gardasil injection. Order to administer given by Quince Bryant, CNM on 05/17/23.    Objective:    LMP 10/27/2023 (Exact Date)   23 y.o. LMP:  ***  Given by: {AOB Clinical JYNWG:[id number]} Site:  {left/right:[id number]} deltoid  Lab Review  No results found for any visits on 11/20/23.    Assessment:   No diagnosis found.   Plan:   Patient will return in prn. Gardasil series complete.    Sandra Dennis, CMA

## 2023-12-18 ENCOUNTER — Telehealth: Payer: Self-pay

## 2023-12-18 NOTE — Telephone Encounter (Signed)
 Ok for E2C2 to review.  Patient self scheduled for a visit using mychart however she is not a current patient in the office and therefor unable to see as scheduled as this is not a new patient appointment. If she needs to be seen sooner than she can be scheduled as a new patient then she should be seen at [facility] if needed.

## 2023-12-20 ENCOUNTER — Ambulatory Visit: Admitting: Nurse Practitioner

## 2023-12-24 ENCOUNTER — Ambulatory Visit: Admitting: Nurse Practitioner

## 2023-12-26 ENCOUNTER — Ambulatory Visit: Admitting: Nurse Practitioner

## 2023-12-28 ENCOUNTER — Encounter: Payer: Self-pay | Admitting: Nurse Practitioner

## 2023-12-28 ENCOUNTER — Ambulatory Visit: Admitting: Nurse Practitioner

## 2023-12-28 VITALS — BP 114/76 | HR 81 | Temp 97.5°F | Ht 68.0 in | Wt 157.0 lb

## 2023-12-28 DIAGNOSIS — F419 Anxiety disorder, unspecified: Secondary | ICD-10-CM | POA: Diagnosis not present

## 2023-12-28 DIAGNOSIS — R110 Nausea: Secondary | ICD-10-CM | POA: Diagnosis not present

## 2023-12-28 DIAGNOSIS — R42 Dizziness and giddiness: Secondary | ICD-10-CM

## 2023-12-28 DIAGNOSIS — F32A Depression, unspecified: Secondary | ICD-10-CM | POA: Diagnosis not present

## 2023-12-28 DIAGNOSIS — R079 Chest pain, unspecified: Secondary | ICD-10-CM | POA: Diagnosis not present

## 2023-12-28 LAB — CBC WITH DIFFERENTIAL/PLATELET
Basophils Absolute: 0 K/uL (ref 0.0–0.1)
Basophils Relative: 0.6 % (ref 0.0–3.0)
Eosinophils Absolute: 0.2 K/uL (ref 0.0–0.7)
Eosinophils Relative: 3.6 % (ref 0.0–5.0)
HCT: 38 % (ref 36.0–46.0)
Hemoglobin: 12.7 g/dL (ref 12.0–15.0)
Lymphocytes Relative: 45.8 % (ref 12.0–46.0)
Lymphs Abs: 2.6 K/uL (ref 0.7–4.0)
MCHC: 33.5 g/dL (ref 30.0–36.0)
MCV: 88.4 fl (ref 78.0–100.0)
Monocytes Absolute: 0.6 K/uL (ref 0.1–1.0)
Monocytes Relative: 10.7 % (ref 3.0–12.0)
Neutro Abs: 2.2 K/uL (ref 1.4–7.7)
Neutrophils Relative %: 39.3 % — ABNORMAL LOW (ref 43.0–77.0)
Platelets: 241 K/uL (ref 150.0–400.0)
RBC: 4.3 Mil/uL (ref 3.87–5.11)
RDW: 12.9 % (ref 11.5–15.5)
WBC: 5.6 K/uL (ref 4.0–10.5)

## 2023-12-28 LAB — COMPREHENSIVE METABOLIC PANEL WITH GFR
ALT: 18 U/L (ref 0–35)
AST: 19 U/L (ref 0–37)
Albumin: 4.3 g/dL (ref 3.5–5.2)
Alkaline Phosphatase: 51 U/L (ref 39–117)
BUN: 12 mg/dL (ref 6–23)
CO2: 28 meq/L (ref 19–32)
Calcium: 9.2 mg/dL (ref 8.4–10.5)
Chloride: 105 meq/L (ref 96–112)
Creatinine, Ser: 0.78 mg/dL (ref 0.40–1.20)
GFR: 105.52 mL/min (ref >60.00)
Glucose, Bld: 66 mg/dL — ABNORMAL LOW (ref 70–99)
Potassium: 3.8 meq/L (ref 3.5–5.1)
Sodium: 139 meq/L (ref 135–145)
Total Bilirubin: 0.7 mg/dL (ref 0.2–1.2)
Total Protein: 7 g/dL (ref 6.0–8.3)

## 2023-12-28 LAB — TSH: TSH: 2.01 u[IU]/mL (ref 0.35–5.50)

## 2023-12-28 LAB — POCT URINE PREGNANCY: Preg Test, Ur: NEGATIVE

## 2023-12-28 MED ORDER — HYDROXYZINE HCL 10 MG PO TABS: 10.0000 mg | ORAL_TABLET | Freq: Two times a day (BID) | ORAL | 0 refills | Status: DC | PRN

## 2023-12-28 MED ORDER — ONDANSETRON HCL 4 MG PO TABS: 4.0000 mg | ORAL_TABLET | Freq: Three times a day (TID) | ORAL | 0 refills | Status: DC | PRN

## 2023-12-28 MED ORDER — HYDROXYZINE HCL 10 MG PO TABS
10.0000 mg | ORAL_TABLET | Freq: Every day | ORAL | 0 refills | Status: AC
Start: 2023-12-28 — End: ?

## 2023-12-28 NOTE — Patient Instructions (Signed)
 You came in today because you missed your period, had chest pain, and felt lightheaded. We discussed your symptoms, including your menstrual irregularities, chest pain, lightheadedness, and anxiety. We have a plan to address each of these issues and will follow up in two weeks to see how you are doing.  YOUR PLAN:  MENSTRUAL IRREGULARITY: You missed your period in June and had a heavy, irregular period in July. -We will do a pregnancy test and some lab tests to check your hormone levels.  LIGHTHEADEDNESS AND NAUSEA: You have been feeling lightheaded and nauseous for the past three weeks, with episodes lasting 20-30 minutes. -We will do some lab tests to find out the cause. -Keep a diary of what you eat and your symptoms.  CHEST PAIN: You have been experiencing chest pain, likely related to stress and anxiety. -Monitor your symptoms and note any patterns or triggers.  ANXIETY AND SLEEP DISTURBANCE: You have high stress and anxiety, which is affecting your sleep. -Take hydroxyzine 10 mg before bed for anxiety and sleep. -Try the medication for 2-3 days and increase the dose if necessary. -We will follow up in two weeks to see how you are doing.

## 2023-12-28 NOTE — Assessment & Plan Note (Addendum)
 Random episodes of nausea. Lasting for about an hour. -Pregnancy test negative.  -Will do trial of zofran  and food dairy.

## 2023-12-28 NOTE — Assessment & Plan Note (Signed)
 Intermittent chest pain from 3 weeks lasting for about 2 minutes. No chest pain at present.  Can be stress-related due to high anxiety and stress levels. - Advise monitoring symptoms and noting any patterns or triggers. -Red flag discussed.  Patient was provided clear instructions to go to ER or urgent care if symptoms does not improve, red flag or new problem develops.  Patient verbalized understanding.

## 2023-12-28 NOTE — Assessment & Plan Note (Signed)
 PHQ -9: 13 and GAD 7 score- 9. High stress and anxiety with sleep difficulties. - Will do trial of hydroxyzine 10 mg for anxiety and sleep, take before bed. - Labs ordered as outlined. - Close follow.

## 2023-12-28 NOTE — Assessment & Plan Note (Signed)
 Random episodes of lightheaded lasting for 20-30 mint. No vertigo. Negative neurological symptoms during the exam. -Will obtained labs as outlined. -Advised proper hydration.  -Close follow up.

## 2023-12-28 NOTE — Progress Notes (Signed)
 Established Patient Office Visit  Subjective:  Patient ID: Sandra Dennis, female    DOB: 2000/10/11  Age: 23 y.o. MRN: [id number]  CC:  Chief Complaint  Patient presents with   Acute Visit    Missed menstrual cycle one month, nauseated, light headed and chest pain x 3 weeks  Symptoms are Intermittent    HPI  Sandra Dennis presents for acute visit. She had missed menstrual period, chest pain, and lightheadedness.  Her menstrual irregularities began with a missed period in June, followed by a heavy and irregular period in July lasting five to six days, with continued spotting and cramping. She is sexually active without protection and stopped using birth control in January.  Chest pain occurs at work and home, presenting as difficulty breathing, lasting about two minutes, and localized to the middle of her chest. There is no associated acid reflux or heavy lifting.  Lightheadedness and nausea have been present for three weeks, with episodes occurring randomly, often at work. Dizziness is described as the room spinning and a feeling of impending fainting, lasting 20 to 30 minutes. She manages these episodes by sitting down and drinking water. No changes in vision or unilateral weakness are noted.  She experiences disrupted sleep, difficulty falling asleep, and feels tired upon waking. She typically sleeps from 3 AM to 9 or 10 AM. She reports job-related stress and has a history of anxiety, previously managed with medication, which she is not currently taking.  Nausea occurs randomly, often after eating or smelling food, lasting about an hour. She does not take any medication for nausea and sometimes skips breakfast.  HPI   Past Medical History:  Diagnosis Date   Anxiety    Asthma    BV (bacterial vaginosis)    Depression     Past Surgical History:  Procedure Laterality Date   INCISION AND DRAINAGE OF PERITONSILLAR ABCESS N/A 01/24/2020   Procedure: INCISION AND DRAINAGE OF  PERITONSILLAR ABCESS;  Surgeon: Edda Mt, MD;  Location: [facility] ORS;  Service: ENT;  Laterality: N/A;   THROAT SURGERY      Family History  Problem Relation Age of Onset   Breast cancer Neg Hx    Ovarian cancer Neg Hx     Social History   Socioeconomic History   Marital status: Single    Spouse name: Not on file   Number of children: Not on file   Years of education: Not on file   Highest education level: Some college, no degree  Occupational History   Not on file  Tobacco Use   Smoking status: Never   Smokeless tobacco: Never  Vaping Use   Vaping status: Never Used  Substance and Sexual Activity   Alcohol use: Yes    Comment: socially   Drug use: Never   Sexual activity: Yes    Birth control/protection: Pill, Condom  Other Topics Concern   Not on file  Social History Narrative   Not on file   Social Drivers of Health   Financial Resource Strain: Low Risk  (12/20/2023)   Overall Financial Resource Strain (CARDIA)    Difficulty of Paying Living Expenses: Not very hard  Food Insecurity: No Food Insecurity (12/20/2023)   Hunger Vital Sign    Worried About Running Out of Food in the Last Year: Never true    Ran Out of Food in the Last Year: Never true  Transportation Needs: No Transportation Needs (12/20/2023)   PRAPARE - Transportation    Lack  of Transportation (Medical): No    Lack of Transportation (Non-Medical): No  Physical Activity: Insufficiently Active (12/20/2023)   Exercise Vital Sign    Days of Exercise per Week: 4 days    Minutes of Exercise per Session: 30 min  Stress: Stress Concern Present (12/20/2023)   [employer] of Occupational Health - Occupational Stress Questionnaire    Feeling of Stress: Rather much  Social Connections: Socially Isolated (12/20/2023)   Social Connection and Isolation Panel    Frequency of Communication with Friends and Family: Twice a week    Frequency of Social Gatherings with Friends and Family: Twice a week     Attends Religious Services: Never    Database administrator or Organizations: No    Attends Engineer, structural: Not on file    Marital Status: Divorced  Catering manager Violence: Not on file     Outpatient Medications Prior to Visit  Medication Sig Dispense Refill   albuterol  (PROVENTIL  HFA;VENTOLIN  HFA) 108 (90 Base) MCG/ACT inhaler Inhale 2 puffs into the lungs every 6 (six) hours as needed for wheezing or shortness of breath. 1 Inhaler 2   norethindrone-ethinyl estradiol -FE (JUNEL FE 1/20) 1-20 MG-MCG tablet Take 1 tablet by mouth daily. 84 tablet 3   Vitamin D , Ergocalciferol , (DRISDOL ) 1.25 MG (50000 UNIT) CAPS capsule Take 1 capsule (50,000 Units total) by mouth every 7 (seven) days. 12 capsule 1   No facility-administered medications prior to visit.    Allergies  Allergen Reactions   Pollen Extract Swelling    Itchy eyes, runny nose Itchy eyes, runny nose     ROS Review of Systems Negative unless indicated in HPI.    Objective:    Physical Exam Constitutional:      Appearance: Normal appearance.  HENT:     Mouth/Throat:     Mouth: Mucous membranes are moist.  Eyes:     Conjunctiva/sclera: Conjunctivae normal.     Pupils: Pupils are equal, round, and reactive to light.  Cardiovascular:     Rate and Rhythm: Normal rate and regular rhythm.     Pulses: Normal pulses.     Heart sounds: Normal heart sounds.  Pulmonary:     Effort: Pulmonary effort is normal.     Breath sounds: Normal breath sounds.  Abdominal:     General: Abdomen is flat. Bowel sounds are normal.     Palpations: Abdomen is soft. There is no mass.     Tenderness: There is no abdominal tenderness.  Musculoskeletal:     Cervical back: Normal range of motion. No tenderness.     Right lower leg: No edema.     Left lower leg: No edema.  Skin:    General: Skin is warm.     Findings: No bruising.  Neurological:     General: No focal deficit present.     Mental Status: She is alert  and oriented to person, place, and time. Mental status is at baseline.     Sensory: No sensory deficit.     Motor: No weakness.     Gait: Gait normal.  Psychiatric:        Mood and Affect: Mood normal.        Behavior: Behavior normal.        Thought Content: Thought content normal.        Judgment: Judgment normal.     BP 114/76   Pulse 81   Temp (!) 97.5 F (36.4 C)   Ht 5' 8 (  1.727 m)   Wt 157 lb (71.2 kg)   LMP 12/15/2023   SpO2 98%   BMI 23.87 kg/m  Wt Readings from Last 3 Encounters:  12/28/23 157 lb (71.2 kg)  10/29/23 155 lb (70.3 kg)  07/18/23 159 lb (72.1 kg)     Health Maintenance  Topic Date Due   Pneumococcal Vaccine 25-77 Years old (1 of 2 - PCV) 07/04/2017   HPV VACCINES (3 - 3-dose series) 10/10/2023   COVID-19 Vaccine (1 - 2024-25 season) 06/11/2024 (Originally 02/11/2023)   INFLUENZA VACCINE  01/11/2024   Cervical Cancer Screening (Pap smear)  05/15/2026   DTaP/Tdap/Td (9 - Td or Tdap) 10/05/2030   Hepatitis B Vaccines  Completed   Hepatitis C Screening  Completed   HIV Screening  Completed   Meningococcal B Vaccine  Aged Out       Topic Date Due   HPV VACCINES (3 - 3-dose series) 10/10/2023    Lab Results  Component Value Date   TSH 1.42 04/05/2023   Lab Results  Component Value Date   WBC 8.5 02/11/2023   HGB 13.9 02/11/2023   HCT 41.4 02/11/2023   MCV 88.1 02/11/2023   PLT 318 02/11/2023   Lab Results  Component Value Date   NA 137 02/11/2023   K 4.3 02/11/2023   CO2 25 02/11/2023   GLUCOSE 93 02/11/2023   BUN 12 02/11/2023   CREATININE 0.97 02/11/2023   BILITOT 1.0 02/11/2023   ALKPHOS 39 02/11/2023   AST 20 02/11/2023   ALT 22 02/11/2023   PROT 7.7 02/11/2023   ALBUMIN 3.8 02/11/2023   CALCIUM 9.0 02/11/2023   ANIONGAP 10 02/11/2023   No results found for: CHOL No results found for: HDL No results found for: LDLCALC No results found for: TRIG No results found for: CHOLHDL No results found for:  HGBA1C    Assessment & Plan:  Nauseous Assessment & Plan: Random episodes of nausea. Lasting for about an hour. -Pregnancy test negative.  -Will do trial of zofran  and food dairy.   Orders: -     POCT urine pregnancy  Anxiety and depression Assessment & Plan: PHQ -9: 13 and GAD 7 score- 9. High stress and anxiety with sleep difficulties. - Will do trial of hydroxyzine 10 mg for anxiety and sleep, take before bed. - Labs ordered as outlined. - Close follow.  Orders: -     CBC with Differential/Platelet -     Comprehensive metabolic panel with GFR -     TSH  Nonspecific chest pain Assessment & Plan: Intermittent chest pain from 3 weeks lasting for about 2 minutes. No chest pain at present.  Can be stress-related due to high anxiety and stress levels. - Advise monitoring symptoms and noting any patterns or triggers. -Red flag discussed.  Patient was provided clear instructions to go to ER or urgent care if symptoms does not improve, red flag or new problem develops.  Patient verbalized understanding.   Orders: -     Comprehensive metabolic panel with GFR -     TSH  Dizziness Assessment & Plan: Random episodes of lightheaded lasting for 20-30 mint. No vertigo. Negative neurological symptoms during the exam. -Will obtained labs as outlined. -Advised proper hydration.  -Close follow up.       Orders: -     CBC with Differential/Platelet -     Comprehensive metabolic panel with GFR -     TSH  Other orders -     Ondansetron  HCl;  Take 1 tablet (4 mg total) by mouth every 8 (eight) hours as needed for nausea or vomiting.  Dispense: 20 tablet; Refill: 0 -     hydrOXYzine HCl; Take 1 tablet (10 mg total) by mouth at bedtime.  Dispense: 30 tablet; Refill: 0    Follow-up: Return in about 2 weeks (around 01/11/2024).   Tashunda Vandezande, NP

## 2024-01-01 ENCOUNTER — Ambulatory Visit: Admitting: Nurse Practitioner

## 2024-01-11 ENCOUNTER — Ambulatory Visit: Admitting: Nurse Practitioner

## 2024-01-11 ENCOUNTER — Encounter: Payer: Self-pay | Admitting: Nurse Practitioner

## 2024-01-11 VITALS — BP 114/74 | HR 77 | Temp 98.0°F | Ht 68.0 in | Wt 158.0 lb

## 2024-01-11 DIAGNOSIS — F419 Anxiety disorder, unspecified: Secondary | ICD-10-CM

## 2024-01-11 DIAGNOSIS — E559 Vitamin D deficiency, unspecified: Secondary | ICD-10-CM

## 2024-01-11 DIAGNOSIS — F32A Depression, unspecified: Secondary | ICD-10-CM

## 2024-01-11 DIAGNOSIS — J4520 Mild intermittent asthma, uncomplicated: Secondary | ICD-10-CM | POA: Diagnosis not present

## 2024-01-11 DIAGNOSIS — L2082 Flexural eczema: Secondary | ICD-10-CM

## 2024-01-11 MED ORDER — ESCITALOPRAM OXALATE 5 MG PO TABS: 5.0000 mg | ORAL_TABLET | Freq: Every day | ORAL | 1 refills | Status: DC

## 2024-01-11 NOTE — Patient Instructions (Signed)
Escitalopram Solution What is this medication? ESCITALOPRAM (es sye TAL oh pram) treats depression and anxiety. It increases the amount of serotonin in the brain, a hormone that helps regulate mood. It belongs to a group of medications called SSRIs. This medicine may be used for other purposes; ask your health care provider or pharmacist if you have questions. COMMON BRAND NAME(S): Lexapro What should I tell my care team before I take this medication? They need to know if you have any of these conditions: Bipolar disorder or a family history of bipolar disorder Diabetes Glaucoma Heart disease Kidney disease Liver disease Receiving electroconvulsive therapy Seizures Suicidal thoughts, plans, or attempt by you or a family member An unusual or allergic reaction to escitalopram, other medications, foods, dyes, or preservatives Pregnant or trying to become pregnant Breastfeeding How should I use this medication? Take this medication by mouth. Follow the directions on the prescription label. Use a specially marked spoon or container to measure your medication. Ask your pharmacist if you do not have one. Household spoons are not accurate. This medication can be taken with or without food. Take your medication at regular intervals. Do not take it more often than directed. Do not stop taking this medication suddenly except upon the advice of your care team. Stopping this medication too quickly may cause serious side effects or your condition may worsen. A special MedGuide will be given to you by the pharmacist with each prescription and refill. Be sure to read this information carefully each time. Talk to your care team about the use of this medication in children. Special care may be needed. Overdosage: If you think you have taken too much of this medicine contact a poison control center or emergency room at once. NOTE: This medicine is only for you. Do not share this medicine with others. What if I  miss a dose? If you miss a dose, take it as soon as you can. If it is almost time for your next dose, take only that dose. Do not take double or extra doses. What may interact with this medication? Do not take this medication with any of the following: Certain medications for fungal infections, such as fluconazole, itraconazole, ketoconazole, posaconazole, voriconazole Cisapride Citalopram Dronedarone Linezolid MAOIs, such as Carbex, Eldepryl, Marplan, Nardil, and Parnate Methylene blue (injected into a vein) Pimozide Thioridazine This medication may also interact with the following: Alcohol Amphetamines Aspirin and aspirin-like medications Carbamazepine Certain medications for mental health conditions Certain medications for migraine headache, such as almotriptan, eletriptan, frovatriptan, naratriptan, rizatriptan, sumatriptan, zolmitriptan Certain medications for sleep Certain medications that treat or prevent blood clots, such as warfarin, enoxaparin, and dalteparin Cimetidine Diuretics Dofetilide Fentanyl Furazolidone Isoniazid Lithium Metoprolol NSAIDs, medications for pain and inflammation, such as ibuprofen or naproxen Other medications that cause heart rhythm changes Procarbazine Rasagiline Supplements, such as St. John's wort, kava kava, valerian Tramadol Tryptophan Ziprasidone This list may not describe all possible interactions. Give your health care provider a list of all the medicines, herbs, non-prescription drugs, or dietary supplements you use. Also tell them if you smoke, drink alcohol, or use illegal drugs. Some items may interact with your medicine. What should I watch for while using this medication? Tell your care team if your symptoms do not get better or if they get worse. Visit your care team for regular checks on your progress. Because it may take several weeks to see the full effects of this medication, it is important to continue your treatment as  prescribed by  your care team. Watch for new or worsening thoughts of suicide or depression. This includes sudden changes in mood, behaviors, or thoughts. These changes can happen at any time but are more common in the beginning of treatment or after a change in dose. Call your care team right away if you experience these thoughts or worsening depression. This medication may cause mood and behavior changes, such as anxiety, nervousness, irritability, hostility, restlessness, excitability, hyperactivity, or trouble sleeping. These changes can happen at any time but are more common in the beginning of treatment or after a change in dose. Call your care team right away if you notice any of these symptoms. This medication may affect your coordination, reaction time, or judgment. Do not drive or operate machinery until you know how this medication affects you. Sit up or stand slowly to reduce the risk of dizzy or fainting spells. Drinking alcohol with this medication can increase the risk of these side effects. Your mouth may get dry. Chewing sugarless gum or sucking hard candy, and drinking plenty of water may help. Contact your care team if the problem does not go away or is severe. What side effects may I notice from receiving this medication? Side effects that you should report to your care team as soon as possible: Allergic reactions--skin rash, itching, hives, swelling of the face, lips, tongue, or throat Bleeding--bloody or black, tar-like stools, red or dark brown urine, vomiting blood or brown material that looks like coffee grounds, small, red or purple spots on skin, unusual bleeding or bruising Heart rhythm changes--fast or irregular heartbeat, dizziness, feeling faint or lightheaded, chest pain, trouble breathing Low sodium level--muscle weakness, fatigue, dizziness, headache, confusion Serotonin syndrome--irritability, confusion, fast or irregular heartbeat, muscle stiffness, twitching muscles,  sweating, high fever, seizure, chills, vomiting, diarrhea Sudden eye pain or change in vision such as blurry vision, seeing halos around lights, vision loss Thoughts of suicide or self-harm, worsening mood, feelings of depression Side effects that usually do not require medical attention (report to your care team if they continue or are bothersome): Change in sex drive or performance Diarrhea Excessive sweating Nausea Tremors or shaking Upset stomach This list may not describe all possible side effects. Call your doctor for medical advice about side effects. You may report side effects to FDA at 1-800-FDA-1088. Where should I keep my medication? Keep out of reach of children and pets. Store at room temperature between 15 and 30 degrees C (59 and 86 degrees F). Throw away any unused medication after the expiration date. NOTE: This sheet is a summary. It may not cover all possible information. If you have questions about this medicine, talk to your doctor, pharmacist, or health care provider.  2024 Elsevier/Gold Standard (2022-03-06 00:00:00)

## 2024-01-11 NOTE — Progress Notes (Signed)
 Established Patient Office Visit  Subjective:  Patient ID: Sandra Dennis, female    DOB: 12/05/2000  Age: 23 y.o. MRN: [id number]  CC:  Chief Complaint  Patient presents with   Establish Care    Transfer of Care   Discussed the use of a AI scribe software for clinical note transcription with the patient, who gave verbal consent to proceed.  HPI  Sandra Dennis is a 23 year old female with anxiety and asthma who presents for transfer of care.  Anxiety is most severe during the day, and she cannot take hydroxyzine  due to sedation. Her depression score is eleven, and her anxiety score is fifteen.  Asthma symptoms are infrequent, primarily occurring in winter or during illness. There is no exercise-induced asthma. She has not been running due to a foot fracture in January.  She discontinued birth control pills due to forgetfulness and is sexually active with one female partner, using condoms. There is no abnormal bleeding, and her last menstrual cycle was around July 4th.  Eczema is present on her arm, and she uses cortisone cream with partial relief.  She completed a high dose of vitamin D  for deficiency but is not taking supplements currently. Her B12 levels were normal in her last blood work.  She occasionally consumes alcohol, does not use recreational drugs, and does not smoke or vape. She works full-time at [employer] and lives with her ten-year-old son. Her parents provide a good support system.  HPI   Past Medical History:  Diagnosis Date   Allergy    Anxiety    Asthma    BV (bacterial vaginosis)    Depression     Past Surgical History:  Procedure Laterality Date   INCISION AND DRAINAGE OF PERITONSILLAR ABCESS N/A 01/24/2020   Procedure: INCISION AND DRAINAGE OF PERITONSILLAR ABCESS;  Surgeon: Edda Mt, MD;  Location: [facility] ORS;  Service: ENT;  Laterality: N/A;   THROAT SURGERY      Family History  Problem Relation Age of Onset   Asthma Other         breast cancer   Ovarian cancer Neg Hx     Social History   Socioeconomic History   Marital status: Single    Spouse name: Not on file   Number of children: Not on file   Years of education: Not on file   Highest education level: Some college, no degree  Occupational History   Not on file  Tobacco Use   Smoking status: Never   Smokeless tobacco: Never  Vaping Use   Vaping status: Never Used  Substance and Sexual Activity   Alcohol use: Yes    Comment: socially   Drug use: Never   Sexual activity: Yes    Partners: Male    Birth control/protection: Condom  Other Topics Concern   Not on file  Social History Narrative   Working in [employer]. Lives with 30 year old son.    Parents like near by.    Social Drivers of Corporate investment banker Strain: Low Risk  (12/20/2023)   Overall Financial Resource Strain (CARDIA)    Difficulty of Paying Living Expenses: Not very hard  Food Insecurity: No Food Insecurity (12/20/2023)   Hunger Vital Sign    Worried About Running Out of Food in the Last Year: Never true    Ran Out of Food in the Last Year: Never true  Transportation Needs: No Transportation Needs (12/20/2023)   PRAPARE - Transportation  Lack of Transportation (Medical): No    Lack of Transportation (Non-Medical): No  Physical Activity: Insufficiently Active (12/20/2023)   Exercise Vital Sign    Days of Exercise per Week: 4 days    Minutes of Exercise per Session: 30 min  Stress: Stress Concern Present (12/20/2023)   [employer] of Occupational Health - Occupational Stress Questionnaire    Feeling of Stress: Rather much  Social Connections: Socially Isolated (12/20/2023)   Social Connection and Isolation Panel    Frequency of Communication with Friends and Family: Twice a week    Frequency of Social Gatherings with Friends and Family: Twice a week    Attends Religious Services: Never    Database administrator or Organizations: No    Attends Museum/gallery exhibitions officer: Not on file    Marital Status: Divorced  Catering manager Violence: Not on file     Outpatient Medications Prior to Visit  Medication Sig Dispense Refill   albuterol  (PROVENTIL  HFA;VENTOLIN  HFA) 108 (90 Base) MCG/ACT inhaler Inhale 2 puffs into the lungs every 6 (six) hours as needed for wheezing or shortness of breath. 1 Inhaler 2   hydrOXYzine  (ATARAX ) 10 MG tablet Take 1 tablet (10 mg total) by mouth at bedtime. 30 tablet 0   ondansetron  (ZOFRAN ) 4 MG tablet Take 1 tablet (4 mg total) by mouth every 8 (eight) hours as needed for nausea or vomiting. 20 tablet 0   norethindrone-ethinyl estradiol -FE (JUNEL FE 1/20) 1-20 MG-MCG tablet Take 1 tablet by mouth daily. 84 tablet 3   Vitamin D , Ergocalciferol , (DRISDOL ) 1.25 MG (50000 UNIT) CAPS capsule Take 1 capsule (50,000 Units total) by mouth every 7 (seven) days. 12 capsule 1   No facility-administered medications prior to visit.    Allergies  Allergen Reactions   Pollen Extract Swelling    Itchy eyes, runny nose Itchy eyes, runny nose     ROS Review of Systems Negative unless indicated in HPI.    Objective:    Physical Exam Constitutional:      Appearance: Normal appearance.  HENT:     Mouth/Throat:     Mouth: Mucous membranes are moist.  Eyes:     Conjunctiva/sclera: Conjunctivae normal.     Pupils: Pupils are equal, round, and reactive to light.  Cardiovascular:     Rate and Rhythm: Normal rate and regular rhythm.     Pulses: Normal pulses.     Heart sounds: Normal heart sounds.  Pulmonary:     Effort: Pulmonary effort is normal.     Breath sounds: Normal breath sounds.  Abdominal:     General: Bowel sounds are normal.     Palpations: Abdomen is soft.  Musculoskeletal:     Cervical back: Normal range of motion. No tenderness.  Skin:    General: Skin is warm.     Findings: No bruising.  Neurological:     General: No focal deficit present.     Mental Status: She is alert and oriented to  person, place, and time. Mental status is at baseline.  Psychiatric:        Mood and Affect: Mood normal.        Behavior: Behavior normal.        Thought Content: Thought content normal.        Judgment: Judgment normal.     BP 114/74   Pulse 77   Temp 98 F (36.7 C)   Ht 5' 8 (1.727 m)   Wt 158 lb (71.7 kg)  LMP 12/15/2023   SpO2 99%   BMI 24.02 kg/m  Wt Readings from Last 3 Encounters:  01/11/24 158 lb (71.7 kg)  12/28/23 157 lb (71.2 kg)  10/29/23 155 lb (70.3 kg)     Health Maintenance  Topic Date Due   Pneumococcal Vaccine: 19-49 Years (1 of 2 - PCV) 07/04/2017   HPV VACCINES (3 - 3-dose series) 10/10/2023   COVID-19 Vaccine (1 - 2024-25 season) 06/11/2024 (Originally 02/11/2023)   INFLUENZA VACCINE  09/09/2024 (Originally 01/11/2024)   Cervical Cancer Screening (Pap smear)  05/15/2026   DTaP/Tdap/Td (9 - Td or Tdap) 10/05/2030   Hepatitis B Vaccines  Completed   Hepatitis C Screening  Completed   HIV Screening  Completed   Meningococcal B Vaccine  Aged Out       Topic Date Due   HPV VACCINES (3 - 3-dose series) 10/10/2023    Lab Results  Component Value Date   TSH 2.01 12/28/2023   Lab Results  Component Value Date   WBC 5.6 12/28/2023   HGB 12.7 12/28/2023   HCT 38.0 12/28/2023   MCV 88.4 12/28/2023   PLT 241.0 12/28/2023   Lab Results  Component Value Date   NA 139 12/28/2023   K 3.8 12/28/2023   CO2 28 12/28/2023   GLUCOSE 66 (L) 12/28/2023   BUN 12 12/28/2023   CREATININE 0.78 12/28/2023   BILITOT 0.7 12/28/2023   ALKPHOS 51 12/28/2023   AST 19 12/28/2023   ALT 18 12/28/2023   PROT 7.0 12/28/2023   ALBUMIN 4.3 12/28/2023   CALCIUM 9.2 12/28/2023   ANIONGAP 10 02/11/2023   GFR 105.52 12/28/2023   No results found for: CHOL No results found for: HDL No results found for: LDLCALC No results found for: TRIG No results found for: CHOLHDL No results found for: YHAJ8R    Assessment & Plan:  Vitamin D   deficiency  Flexural eczema Assessment & Plan: Eczema on the arm with occasional breakouts. Cortisone cream provides partial relief. - Continue using cortisone cream for eczema management.   Mild intermittent asthma without complication Assessment & Plan: Asthma is well-controlled with infrequent inhaler use, primarily in winter or when sick    Anxiety and depression Assessment & Plan: GAD score 15 and PHQ 9 score 11. Hydroxyzine  causes excessive drowsiness. - Discontinue hydroxyzine  due to excessive drowsiness. - Initiate Lexapro  (escitalopram ) at 5 mg daily. Discussed potential side effects including nausea, decreased libido, and excessive sweating.   Other orders -     Escitalopram  Oxalate; Take 1 tablet (5 mg total) by mouth daily.  Dispense: 30 tablet; Refill: 1    Follow-up: Return in about 1 month (around 02/11/2024) for Anxiety and depression.   Ercel Pepitone, NP

## 2024-01-22 NOTE — Assessment & Plan Note (Signed)
 GAD score 15 and PHQ 9 score 11. Hydroxyzine  causes excessive drowsiness. - Discontinue hydroxyzine  due to excessive drowsiness. - Initiate Lexapro  (escitalopram ) at 5 mg daily. Discussed potential side effects including nausea, decreased libido, and excessive sweating.

## 2024-01-22 NOTE — Assessment & Plan Note (Signed)
 Asthma is well-controlled with infrequent inhaler use, primarily in winter or when sick

## 2024-01-22 NOTE — Assessment & Plan Note (Signed)
 Eczema on the arm with occasional breakouts. Cortisone cream provides partial relief. - Continue using cortisone cream for eczema management.

## 2024-02-15 ENCOUNTER — Ambulatory Visit: Admitting: Nurse Practitioner

## 2024-02-15 ENCOUNTER — Encounter: Payer: Self-pay | Admitting: Nurse Practitioner

## 2024-02-15 VITALS — BP 104/68 | HR 105 | Temp 98.7°F | Ht 68.0 in | Wt 161.0 lb

## 2024-02-15 DIAGNOSIS — Z3201 Encounter for pregnancy test, result positive: Secondary | ICD-10-CM | POA: Diagnosis not present

## 2024-02-15 DIAGNOSIS — N926 Irregular menstruation, unspecified: Secondary | ICD-10-CM

## 2024-02-15 DIAGNOSIS — F32A Depression, unspecified: Secondary | ICD-10-CM | POA: Diagnosis not present

## 2024-02-15 DIAGNOSIS — F419 Anxiety disorder, unspecified: Secondary | ICD-10-CM | POA: Diagnosis not present

## 2024-02-15 LAB — POCT URINE PREGNANCY: Preg Test, Ur: POSITIVE — AB

## 2024-02-15 NOTE — Progress Notes (Signed)
 Established Patient Office Visit  Subjective:  Patient ID: Sandra Dennis, female    DOB: 2001-03-28  Age: 23 y.o. MRN: [id number]  CC:  Chief Complaint  Patient presents with   Medical Management of Chronic Issues   Discussed the use of a AI scribe software for clinical note transcription with the patient, who gave verbal consent to proceed.  HPI  Sandra Dennis is a 23 year old female who presents for follow-up regarding her medication for anxiety and depression.  She takes Lexapro  5 mg daily, which effectively manages her anxiety and depression. She takes it in the middle of the day. Hydroxyzine  is used as needed for sleep difficulties  Patient state that her Menstrual cycle is 1 week late and would like get a pregnancy test.  Her last menstrual period was from August 1st to 4th, with sexual activity around mid-August.  She experiences occasional chest pain in the middle of her chest, starting on Monday at 9 AM and resolving after a nap. The pain is associated with feeling overwhelmed at work.   Sandra Dennis chest pain at present. Sandra Dennis nausea, vomiting, or intolerance to smells.   HPI   Past Medical History:  Diagnosis Date   Allergy    Anxiety    Asthma    BV (bacterial vaginosis)    Depression     Past Surgical History:  Procedure Laterality Date   INCISION AND DRAINAGE OF PERITONSILLAR ABCESS N/A 01/24/2020   Procedure: INCISION AND DRAINAGE OF PERITONSILLAR ABCESS;  Surgeon: Edda Mt, MD;  Location: [facility] ORS;  Service: ENT;  Laterality: N/A;   THROAT SURGERY      Family History  Problem Relation Age of Onset   Asthma Other        breast cancer   Ovarian cancer Neg Hx     Social History   Socioeconomic History   Marital status: Single    Spouse name: Not on file   Number of children: Not on file   Years of education: Not on file   Highest education level: Some college, no degree  Occupational History   Not on file  Tobacco Use   Smoking status:  Never   Smokeless tobacco: Never  Vaping Use   Vaping status: Never Used  Substance and Sexual Activity   Alcohol use: Yes    Comment: socially   Drug use: Never   Sexual activity: Yes    Partners: Male    Birth control/protection: Condom  Other Topics Concern   Not on file  Social History Narrative   Working in [employer]. Lives with 38 year old son.    Parents like near by.    Social Drivers of Corporate investment banker Strain: Low Risk  (12/20/2023)   Overall Financial Resource Strain (CARDIA)    Difficulty of Paying Living Expenses: Not very hard  Food Insecurity: No Food Insecurity (12/20/2023)   Hunger Vital Sign    Worried About Running Out of Food in the Last Year: Never true    Ran Out of Food in the Last Year: Never true  Transportation Needs: No Transportation Needs (12/20/2023)   PRAPARE - Administrator, Civil Service (Medical): No    Lack of Transportation (Non-Medical): No  Physical Activity: Insufficiently Active (12/20/2023)   Exercise Vital Sign    Days of Exercise per Week: 4 days    Minutes of Exercise per Session: 30 min  Stress: Stress Concern Present (12/20/2023)   [place]  [employer] - Occupational Stress Questionnaire    Feeling of Stress: Rather much  Social Connections: Socially Isolated (12/20/2023)   Social Connection and Isolation Panel    Frequency of Communication with Friends and Family: Twice a week    Frequency of Social Gatherings with Friends and Family: Twice a week    Attends Religious Services: Never    Database administrator or Organizations: No    Attends Engineer, structural: Not on file    Marital Status: Divorced  Catering manager Violence: Not on file     Outpatient Medications Prior to Visit  Medication Sig Dispense Refill   albuterol  (PROVENTIL  HFA;VENTOLIN  HFA) 108 (90 Base) MCG/ACT inhaler Inhale 2 puffs into the lungs every 6 (six) hours as needed for wheezing or shortness of  breath. 1 Inhaler 2   escitalopram  (LEXAPRO ) 5 MG tablet Take 1 tablet (5 mg total) by mouth daily. 30 tablet 1   hydrOXYzine  (ATARAX ) 10 MG tablet Take 1 tablet (10 mg total) by mouth at bedtime. 30 tablet 0   ondansetron  (ZOFRAN ) 4 MG tablet Take 1 tablet (4 mg total) by mouth every 8 (eight) hours as needed for nausea or vomiting. 20 tablet 0   No facility-administered medications prior to visit.    Allergies  Allergen Reactions   Pollen Extract Swelling    Itchy eyes, runny nose Itchy eyes, runny nose     ROS Review of Systems Negative unless indicated in HPI.    Objective:    Physical Exam Constitutional:      Appearance: Normal appearance.  HENT:     Mouth/Throat:     Mouth: Mucous membranes are moist.  Eyes:     Conjunctiva/sclera: Conjunctivae normal.     Pupils: Pupils are equal, round, and reactive to light.  Cardiovascular:     Rate and Rhythm: Normal rate and regular rhythm.     Pulses: Normal pulses.     Heart sounds: Normal heart sounds.  Pulmonary:     Effort: Pulmonary effort is normal.     Breath sounds: Normal breath sounds.  Musculoskeletal:     Cervical back: Normal range of motion. No tenderness.  Skin:    General: Skin is warm.     Findings: No bruising.  Neurological:     General: No focal deficit present.     Mental Status: She is alert and oriented to person, place, and time. Mental status is at baseline.  Psychiatric:        Mood and Affect: Mood normal.        Behavior: Behavior normal.        Thought Content: Thought content normal.     BP 104/68   Pulse (!) 105   Temp 98.7 F (37.1 C)   Ht 5' 8 (1.727 m)   Wt 161 lb (73 kg)   LMP 01/11/2024 (Exact Date)   SpO2 99%   BMI 24.48 kg/m  Wt Readings from Last 3 Encounters:  02/15/24 161 lb (73 kg)  01/11/24 158 lb (71.7 kg)  12/28/23 157 lb (71.2 kg)     Health Maintenance  Topic Date Due   Pneumococcal Vaccine (1 of 2 - PCV) 07/04/2017   HPV VACCINES (3 - 3-dose  series) 10/10/2023   COVID-19 Vaccine (1 - 2024-25 season) 03/01/2024 (Originally 02/11/2024)   Influenza Vaccine  09/09/2024 (Originally 01/11/2024)   Cervical Cancer Screening (Pap smear)  05/15/2026   DTaP/Tdap/Td (9 - Td or Tdap) 10/05/2030  Hepatitis C Screening  Completed   HIV Screening  Completed   Meningococcal B Vaccine  Aged Out   Hepatitis B Vaccines 19-59 Average Risk  Discontinued       Topic Date Due   HPV VACCINES (3 - 3-dose series) 10/10/2023    Lab Results  Component Value Date   TSH 2.01 12/28/2023   Lab Results  Component Value Date   WBC 5.6 12/28/2023   HGB 12.7 12/28/2023   HCT 38.0 12/28/2023   MCV 88.4 12/28/2023   PLT 241.0 12/28/2023   Lab Results  Component Value Date   NA 139 12/28/2023   K 3.8 12/28/2023   CO2 28 12/28/2023   GLUCOSE 66 (L) 12/28/2023   BUN 12 12/28/2023   CREATININE 0.78 12/28/2023   BILITOT 0.7 12/28/2023   ALKPHOS 51 12/28/2023   AST 19 12/28/2023   ALT 18 12/28/2023   PROT 7.0 12/28/2023   ALBUMIN 4.3 12/28/2023   CALCIUM 9.2 12/28/2023   ANIONGAP 10 02/11/2023   GFR 105.52 12/28/2023   No results found for: CHOL No results found for: HDL No results found for: LDLCALC No results found for: TRIG No results found for: CHOLHDL No results found for: YHAJ8R    Assessment & Plan:  Anxiety and depression Assessment & Plan: GAD 7 score 10 and PHQ 9 score 10. Stable on medication. - Continue lexapro  5 mg daily.    Menstrual period late -     POCT urine pregnancy  Positive urine pregnancy test Assessment & Plan: Positive urine pregnancy test in the office. Pt states that she had a positive home pregnancy test this morning.   Discussed medication safety during pregnancy. - Refer to OBGYN for prenatal care, she is followed by [facility].  - Advise prenatal vitamins. - Advise no smoking, alcohol or recreational drug use.  - Discuss Lexapro  and hydroxyzine  with OBGYN.   Orders: -      Ambulatory referral to Obstetrics / Gynecology    Follow-up: Return in about 6 months (around 08/14/2024) for chronic management.   Honestee Revard, NP

## 2024-02-15 NOTE — Assessment & Plan Note (Signed)
 Positive urine pregnancy test in the office. Pt states that she had a positive home pregnancy test this morning.   Discussed medication safety during pregnancy. - Refer to OBGYN for prenatal care, she is followed by [facility].  - Advise prenatal vitamins. - Advise no smoking, alcohol or recreational drug use.  - Discuss Lexapro  and hydroxyzine  with OBGYN.

## 2024-02-15 NOTE — Assessment & Plan Note (Addendum)
 GAD 7 score 10 and PHQ 9 score 10. Stable on medication. - Continue lexapro  5 mg daily.

## 2024-02-29 ENCOUNTER — Telehealth

## 2024-03-14 DIAGNOSIS — Z30011 Encounter for initial prescription of contraceptive pills: Secondary | ICD-10-CM | POA: Diagnosis not present

## 2024-03-21 ENCOUNTER — Other Ambulatory Visit

## 2024-03-28 ENCOUNTER — Encounter: Payer: Self-pay | Admitting: Nurse Practitioner

## 2024-03-28 ENCOUNTER — Other Ambulatory Visit (HOSPITAL_COMMUNITY)
Admission: RE | Admit: 2024-03-28 | Discharge: 2024-03-28 | Disposition: A | Discharge status: Home / Self Care | Source: Ambulatory Visit | Attending: Nurse Practitioner | Admitting: Nurse Practitioner

## 2024-03-28 ENCOUNTER — Ambulatory Visit: Admitting: Nurse Practitioner

## 2024-03-28 VITALS — BP 116/64 | HR 62 | Temp 97.7°F | Ht 68.0 in | Wt 157.4 lb

## 2024-03-28 DIAGNOSIS — Z113 Encounter for screening for infections with a predominantly sexual mode of transmission: Secondary | ICD-10-CM | POA: Diagnosis not present

## 2024-03-28 DIAGNOSIS — Z332 Encounter for elective termination of pregnancy: Secondary | ICD-10-CM | POA: Diagnosis not present

## 2024-03-28 DIAGNOSIS — N912 Amenorrhea, unspecified: Secondary | ICD-10-CM

## 2024-03-28 LAB — POCT URINE PREGNANCY: Preg Test, Ur: NEGATIVE

## 2024-03-28 LAB — HCG, QUANTITATIVE, PREGNANCY: Quantitative HCG: 9.35 m[IU]/mL

## 2024-03-28 NOTE — Progress Notes (Signed)
 Established Patient Office Visit  Subjective:  Patient ID: GETSEMANI LINDON, female    DOB: 08/27/2000  Age: 23 y.o. MRN: [id number]  CC:  Chief Complaint  Patient presents with   Acute Visit    Pregnancy test and check for STD's   Discussed the use of a AI scribe software for clinical note transcription with the patient, who gave verbal consent to proceed.  HPI  VENESSA WICKHAM is a 23 year old female who presents for follow-up after a medical abortion and to confirm the absence of pregnancy.  She underwent a medical abortion six to seven weeks ago [facility] a [facility] in [place], taking  abortion pill. Post-medication, she experienced bleeding with clots around September 19th, lasting two days. Since then, she has not had a regular menstrual period.  She has engaged in unprotected intercourse since the abortion and is concerned about a possible new pregnancy. Home pregnancy tests have shown inconsistent results, including faint lines and one blank test.   She feels stressed about her current situation and would like to get a pregnancy test and STI testing.  Denies any abdominal pain, nausea, vomiting. She denies symptoms of sexually transmitted infections.  HPI   Past Medical History:  Diagnosis Date   Allergy    Anxiety    Asthma    BV (bacterial vaginosis)    Depression     Past Surgical History:  Procedure Laterality Date   INCISION AND DRAINAGE OF PERITONSILLAR ABCESS N/A 01/24/2020   Procedure: INCISION AND DRAINAGE OF PERITONSILLAR ABCESS;  Surgeon: Edda Mt, MD;  Location: [facility] ORS;  Service: ENT;  Laterality: N/A;   THROAT SURGERY      Family History  Problem Relation Age of Onset   Asthma Other        breast cancer   Ovarian cancer Neg Hx     Social History   Socioeconomic History   Marital status: Single    Spouse name: Not on file   Number of children: Not on file   Years of education: Not on file   Highest education level: Some college,  no degree  Occupational History   Not on file  Tobacco Use   Smoking status: Never   Smokeless tobacco: Never  Vaping Use   Vaping status: Never Used  Substance and Sexual Activity   Alcohol use: Yes    Comment: socially   Drug use: Never   Sexual activity: Yes    Partners: Male    Birth control/protection: Condom  Other Topics Concern   Not on file  Social History Narrative   Working in [employer]. Lives with 78 year old son.    Parents like near by.    Social Drivers of Corporate Investment Banker Strain: Low Risk  (03/27/2024)   Overall Financial Resource Strain (CARDIA)    Difficulty of Paying Living Expenses: Not very hard  Food Insecurity: No Food Insecurity (03/27/2024)   Hunger Vital Sign    Worried About Running Out of Food in the Last Year: Never true    Ran Out of Food in the Last Year: Never true  Transportation Needs: No Transportation Needs (03/27/2024)   PRAPARE - Administrator, Civil Service (Medical): No    Lack of Transportation (Non-Medical): No  Physical Activity: Sufficiently Active (03/27/2024)   Exercise Vital Sign    Days of Exercise per Week: 5 days    Minutes of Exercise per Session: 50 min  Stress:  Stress Concern Present (03/27/2024)   [employer] of Occupational Health - Occupational Stress Questionnaire    Feeling of Stress: To some extent  Social Connections: Socially Isolated (03/27/2024)   Social Connection and Isolation Panel    Frequency of Communication with Friends and Family: Twice a week    Frequency of Social Gatherings with Friends and Family: Twice a week    Attends Religious Services: Never    Database Administrator or Organizations: No    Attends Engineer, Structural: Not on file    Marital Status: Never married  Intimate Partner Violence: Not on file     Outpatient Medications Prior to Visit  Medication Sig Dispense Refill   albuterol  (PROVENTIL  HFA;VENTOLIN  HFA) 108 (90 Base) MCG/ACT  inhaler Inhale 2 puffs into the lungs every 6 (six) hours as needed for wheezing or shortness of breath. 1 Inhaler 2   escitalopram  (LEXAPRO ) 5 MG tablet Take 1 tablet (5 mg total) by mouth daily. 30 tablet 1   hydrOXYzine  (ATARAX ) 10 MG tablet Take 1 tablet (10 mg total) by mouth at bedtime. 30 tablet 0   ondansetron  (ZOFRAN ) 4 MG tablet Take 1 tablet (4 mg total) by mouth every 8 (eight) hours as needed for nausea or vomiting. 20 tablet 0   No facility-administered medications prior to visit.    Allergies  Allergen Reactions   Pollen Extract Swelling    Itchy eyes, runny nose Itchy eyes, runny nose     ROS Review of Systems Negative unless indicated in HPI.    Objective:    Physical Exam Constitutional:      Appearance: Normal appearance.  HENT:     Mouth/Throat:     Mouth: Mucous membranes are moist.  Eyes:     Conjunctiva/sclera: Conjunctivae normal.     Pupils: Pupils are equal, round, and reactive to light.  Cardiovascular:     Rate and Rhythm: Normal rate and regular rhythm.     Pulses: Normal pulses.     Heart sounds: Normal heart sounds.  Pulmonary:     Effort: Pulmonary effort is normal.     Breath sounds: Normal breath sounds.  Abdominal:     General: Bowel sounds are normal.     Palpations: Abdomen is soft.  Musculoskeletal:     Cervical back: Normal range of motion. No tenderness.  Skin:    General: Skin is warm.     Findings: No bruising.  Neurological:     General: No focal deficit present.     Mental Status: She is alert and oriented to person, place, and time. Mental status is at baseline.  Psychiatric:        Mood and Affect: Mood normal.        Behavior: Behavior normal.        Thought Content: Thought content normal.        Judgment: Judgment normal.     BP 116/64   Pulse 62   Temp 97.7 F (36.5 C)   Ht 5' 8 (1.727 m)   Wt 157 lb 6.4 oz (71.4 kg)   LMP 01/11/2024   SpO2 99%   BMI 23.93 kg/m  Wt Readings from Last 3 Encounters:   03/28/24 157 lb 6.4 oz (71.4 kg)  02/15/24 161 lb (73 kg)  01/11/24 158 lb (71.7 kg)     Health Maintenance  Topic Date Due   Pneumococcal Vaccine (1 of 2 - PCV) 07/04/2017   HPV VACCINES (3 - 3-dose series) 10/10/2023  COVID-19 Vaccine (1 - 2025-26 season) 04/11/2024 (Originally 02/11/2024)   Influenza Vaccine  09/09/2024 (Originally 01/11/2024)   Cervical Cancer Screening (Pap smear)  05/15/2026   DTaP/Tdap/Td (9 - Td or Tdap) 10/05/2030   Hepatitis C Screening  Completed   HIV Screening  Completed   Meningococcal B Vaccine  Aged Out   Hepatitis B Vaccines 19-59 Average Risk  Discontinued       Topic Date Due   HPV VACCINES (3 - 3-dose series) 10/10/2023    Lab Results  Component Value Date   TSH 2.01 12/28/2023   Lab Results  Component Value Date   WBC 5.6 12/28/2023   HGB 12.7 12/28/2023   HCT 38.0 12/28/2023   MCV 88.4 12/28/2023   PLT 241.0 12/28/2023   Lab Results  Component Value Date   NA 139 12/28/2023   K 3.8 12/28/2023   CO2 28 12/28/2023   GLUCOSE 66 (L) 12/28/2023   BUN 12 12/28/2023   CREATININE 0.78 12/28/2023   BILITOT 0.7 12/28/2023   ALKPHOS 51 12/28/2023   AST 19 12/28/2023   ALT 18 12/28/2023   PROT 7.0 12/28/2023   ALBUMIN 4.3 12/28/2023   CALCIUM 9.2 12/28/2023   ANIONGAP 10 02/11/2023   GFR 105.52 12/28/2023   No results found for: CHOL No results found for: HDL No results found for: LDLCALC No results found for: TRIG No results found for: CHOLHDL No results found for: YHAJ8R    Assessment & Plan:  Amenorrhea Assessment & Plan: No menstruation since medication abortion. Inconsistent home pregnancy test results. - Urine pregnancy test negative.    Screen for STD (sexually transmitted disease) Assessment & Plan: No STI symptoms.  Has unprotected coitus.  Requesting STI testing. -Labs ordered as outlined   Orders: -     Urine cytology ancillary only  Abortion on demand -     POCT urine pregnancy -      hCG, quantitative, pregnancy; Future    Follow-up: Return if symptoms worsen or fail to improve.   Layci Stenglein, NP

## 2024-03-31 LAB — URINE CYTOLOGY ANCILLARY ONLY
Chlamydia: NEGATIVE
Comment: NEGATIVE
Comment: NEGATIVE
Comment: NORMAL
Neisseria Gonorrhea: NEGATIVE
Trichomonas: NEGATIVE

## 2024-04-04 ENCOUNTER — Encounter: Admitting: Licensed Practical Nurse

## 2024-04-08 ENCOUNTER — Ambulatory Visit: Payer: Self-pay | Admitting: Nurse Practitioner

## 2024-04-08 DIAGNOSIS — Z332 Encounter for elective termination of pregnancy: Secondary | ICD-10-CM

## 2024-04-08 NOTE — Assessment & Plan Note (Signed)
 No menstruation since medication abortion. Inconsistent home pregnancy test results. - Urine pregnancy test negative.

## 2024-04-08 NOTE — Progress Notes (Signed)
 Please inform patient: Your hCG level is borderline.  We can repeat the hCG test again to see if it stays same or drops.  Please schedule appointment for nonfasting lab. STI negative.

## 2024-04-08 NOTE — Assessment & Plan Note (Signed)
 No STI symptoms.  Has unprotected coitus.  Requesting STI testing. -Labs ordered as outlined

## 2024-04-09 ENCOUNTER — Other Ambulatory Visit

## 2024-04-09 DIAGNOSIS — Z332 Encounter for elective termination of pregnancy: Secondary | ICD-10-CM | POA: Diagnosis not present

## 2024-04-09 LAB — HCG, QUANTITATIVE, PREGNANCY: Quantitative HCG: 2.36 m[IU]/mL

## 2024-04-15 ENCOUNTER — Ambulatory Visit: Payer: Self-pay | Admitting: Nurse Practitioner

## 2024-04-24 ENCOUNTER — Other Ambulatory Visit: Payer: Self-pay | Admitting: Nurse Practitioner

## 2024-04-24 DIAGNOSIS — R7989 Other specified abnormal findings of blood chemistry: Secondary | ICD-10-CM

## 2024-04-25 NOTE — Telephone Encounter (Signed)
Please schedule appointment for pt

## 2024-04-25 NOTE — Telephone Encounter (Signed)
 Patient is scheduled

## 2024-05-19 NOTE — Progress Notes (Unsigned)
 PCP:  Vincente Saber, NP   No chief complaint on file.    HPI:      [name] is a 23 y.o. G1P1001 whose LMP was No LMP recorded., presents today for her annual examination.  Her menses are regular every 28-30 days, lasting 5 days.  Dysmenorrhea mild, occurring first 1-2 days of flow. She does not have intermenstrual bleeding.  Sex activity: single partner, contraception - OCP (estrogen/progesterone). Nexplanon removed 03/16/20, started on OCPs. No pain/bleeding. ???pos pregnant 10/25? Last Pap: 05/16/23 Results were: no abnormalities  Hx of STDs: chlamydia in past; trich 2023 with neg TOC 9/23  There is no FH of breast cancer. There is no FH of ovarian cancer. The patient does not do self-breast exams.  Tobacco use: The patient denies current or previous tobacco use. Alcohol use: social drinker No drug use.  Exercise: moderately active  She does get adequate calcium and Vitamin D  in her diet. Unsure if [place] done.  Patient Active Problem List   Diagnosis Date Noted   Amenorrhea 03/28/2024   Abortion on demand 03/28/2024   Positive urine pregnancy test 02/15/2024   Nauseous 12/28/2023   Anxiety and depression 12/28/2023   Nonspecific chest pain 12/28/2023   Asthma 04/08/2023   Eczema 04/08/2023   Abnormal uterine bleeding (AUB) 04/05/2023   Need for hepatitis C screening test 04/05/2023   Encounter for screening for HIV 04/05/2023   Thyroid  disorder screening 04/05/2023   Vitamin B 12 deficiency 04/05/2023   Vitamin D  deficiency 04/05/2023   Chlamydia 05/10/2021   Peritonsillar abscess 01/24/2020   Dizziness 07/16/2018   Healthcare maintenance 07/16/2018   Screen for STD (sexually transmitted disease) 04/13/2016   Slow transit constipation 04/13/2016   Abnormal uterine bleeding 04/12/2016    Past Surgical History:  Procedure Laterality Date   INCISION AND DRAINAGE OF PERITONSILLAR ABCESS N/A 01/24/2020   Procedure: INCISION AND DRAINAGE OF  PERITONSILLAR ABCESS;  Surgeon: Edda Mt, MD;  Location: [facility] OR[facility];  Service: ENT;  Laterality: N/A;   THROAT SURGERY      Family History  Problem Relation Age of Onset   Asthma Other        breast cancer   Ovarian cancer Neg Hx     Social History   Socioeconomic History   Marital status: Single    Spouse name: Not on file   Number of children: Not on file   Years of education: Not on file   Highest education level: Some college, no degree  Occupational History   Not on file  Tobacco Use   Smoking status: Never   Smokeless tobacco: Never  Vaping Use   Vaping status: Never Used  Substance and Sexual Activity   Alcohol use: Yes    Comment: socially   Drug use: Never   Sexual activity: Yes    Partners: Male    Birth control/protection: Condom  Other Topics Concern   Not on file  Social History Narrative   Working in [employer]. Lives with 81 year old son.    Parents like near by.    Social Drivers of Corporate Investment Banker Strain: Low Risk  (03/27/2024)   Overall Financial Resource Strain (CARDIA)    Difficulty of Paying Living Expenses: Not very hard  Food Insecurity: No Food Insecurity (03/27/2024)   Hunger Vital Sign    Worried About Running Out of Food in the Last Year: Never true    Ran Out of Food in the Last  Year: Never true  Transportation Needs: No Transportation Needs (03/27/2024)   PRAPARE - Administrator, Civil Service (Medical): No    Lack of Transportation (Non-Medical): No  Physical Activity: Sufficiently Active (03/27/2024)   Exercise Vital Sign    Days of Exercise per Week: 5 days    Minutes of Exercise per Session: 50 min  Stress: Stress Concern Present (03/27/2024)   [employer] of Occupational Health - Occupational Stress Questionnaire    Feeling of Stress: To some extent  Social Connections: Socially Isolated (03/27/2024)   Social Connection and Isolation Panel    Frequency of Communication with Friends and  Family: Twice a week    Frequency of Social Gatherings with Friends and Family: Twice a week    Attends Religious Services: Never    Database Administrator or Organizations: No    Attends Engineer, Structural: Not on file    Marital Status: Never married  Catering Manager Violence: Not on file     Current Outpatient Medications:    albuterol  (PROVENTIL  HFA;VENTOLIN  HFA) 108 (90 Base) MCG/ACT inhaler, Inhale 2 puffs into the lungs every 6 (six) hours as needed for wheezing or shortness of breath., Disp: 1 Inhaler, Rfl: 2   escitalopram  (LEXAPRO ) 5 MG tablet, Take 1 tablet (5 mg total) by mouth daily., Disp: 30 tablet, Rfl: 1   hydrOXYzine  (ATARAX ) 10 MG tablet, Take 1 tablet (10 mg total) by mouth at bedtime., Disp: 30 tablet, Rfl: 0   ondansetron  (ZOFRAN ) 4 MG tablet, Take 1 tablet (4 mg total) by mouth every 8 (eight) hours as needed for nausea or vomiting., Disp: 20 tablet, Rfl: 0     ROS:  Review of Systems  Constitutional:  Negative for fatigue, fever and unexpected weight change.  Respiratory:  Negative for cough, shortness of breath and wheezing.   Cardiovascular:  Negative for chest pain, palpitations and leg swelling.  Gastrointestinal:  Negative for blood in stool, constipation, diarrhea, nausea and vomiting.  Endocrine: Negative for cold intolerance, heat intolerance and polyuria.  Genitourinary:  Negative for dyspareunia, dysuria, flank pain, frequency, genital sores, hematuria, menstrual problem, pelvic pain, urgency, vaginal bleeding, vaginal discharge and vaginal pain.  Musculoskeletal:  Positive for arthralgias. Negative for back pain, joint swelling and myalgias.  Skin:  Negative for rash.  Neurological:  Negative for dizziness, syncope, light-headedness, numbness and headaches.  Hematological:  Negative for adenopathy.  Psychiatric/Behavioral:  Negative for agitation, confusion, sleep disturbance and suicidal ideas. The patient is not nervous/anxious.     BREAST: No symptoms   Objective: There were no vitals taken for this visit.   Physical Exam Constitutional:      Appearance: She is well-developed.  Genitourinary:     Vulva normal.     Right Labia: No rash, tenderness or lesions.    Left Labia: No tenderness, lesions or rash.    No vaginal discharge, erythema or tenderness.      Right Adnexa: not tender and no mass present.    Left Adnexa: not tender and no mass present.    No cervical friability or polyp.     Uterus is not enlarged or tender.  Breasts:    Right: No mass, nipple discharge, skin change or tenderness.     Left: No mass, nipple discharge, skin change or tenderness.  Neck:     Thyroid : No thyromegaly.  Cardiovascular:     Rate and Rhythm: Normal rate and regular rhythm.     Heart sounds: Normal  heart sounds. No murmur heard. Pulmonary:     Effort: Pulmonary effort is normal.     Breath sounds: Normal breath sounds.  Abdominal:     Palpations: Abdomen is soft.     Tenderness: There is no abdominal tenderness. There is no guarding or rebound.  Musculoskeletal:        General: Normal range of motion.     Cervical back: Normal range of motion.  Lymphadenopathy:     Cervical: No cervical adenopathy.  Neurological:     General: No focal deficit present.     Mental Status: She is alert and oriented to person, place, and time.     Cranial Nerves: No cranial nerve deficit.  Skin:    General: Skin is warm and dry.  Psychiatric:        Mood and Affect: Mood normal.        Behavior: Behavior normal.        Thought Content: Thought content normal.        Judgment: Judgment normal.  Vitals reviewed.     Assessment/Plan: Encounter for annual routine gynecological examination  Screening for STD (sexually transmitted disease) - Plan: Cervicovaginal ancillary only  Encounter for surveillance of contraceptive pills - Plan: norethindrone-ethinyl estradiol -FE (JUNEL FE 1/20) 1-20 MG-MCG tablet; OCP RF.    No  orders of the defined types were placed in this encounter.             GYN counsel adequate intake of calcium and vitamin D , diet and exercise; gardasil discussed--pt to check with parents if done     F/U  No follow-ups on file.  Camry Theiss B. Courtni Balash, PA-C 05/19/2024 5:33 PM

## 2024-05-20 ENCOUNTER — Encounter: Payer: Self-pay | Admitting: Nurse Practitioner

## 2024-05-20 ENCOUNTER — Ambulatory Visit: Admitting: Nurse Practitioner

## 2024-05-20 ENCOUNTER — Ambulatory Visit: Admitting: Obstetrics and Gynecology

## 2024-05-20 VITALS — BP 112/68 | HR 83 | Temp 98.3°F | Ht 68.0 in | Wt 154.0 lb

## 2024-05-20 DIAGNOSIS — N926 Irregular menstruation, unspecified: Secondary | ICD-10-CM | POA: Diagnosis not present

## 2024-05-20 DIAGNOSIS — R7989 Other specified abnormal findings of blood chemistry: Secondary | ICD-10-CM

## 2024-05-20 LAB — POCT URINE PREGNANCY: Preg Test, Ur: NEGATIVE

## 2024-05-20 MED ORDER — ESCITALOPRAM OXALATE 5 MG PO TABS: 5.0000 mg | ORAL_TABLET | Freq: Every day | ORAL | 2 refills | Status: AC

## 2024-05-20 NOTE — Progress Notes (Unsigned)
 Established Patient Office Visit  Subjective:  Patient ID: Sandra Dennis, female    DOB: 10/03/2000  Age: 23 y.o. MRN: [id number]  CC:  Chief Complaint  Patient presents with   Acute Visit    Pregnancy concern- LMP 04/11/24 Anxiety and Depression   Discussed the use of AI scribe software for clinical note transcription with the patient, who gave verbal consent to proceed.  History of Present Illness   Last LMP 04/07/24  Past Medical History:  Diagnosis Date   Allergy    Anxiety    Asthma    BV (bacterial vaginosis)    Depression     Past Surgical History:  Procedure Laterality Date   INCISION AND DRAINAGE OF PERITONSILLAR ABCESS N/A 01/24/2020   Procedure: INCISION AND DRAINAGE OF PERITONSILLAR ABCESS;  Surgeon: Edda Mt, MD;  Location: [facility];  Service: ENT;  Laterality: N/A;   THROAT SURGERY      Family History  Problem Relation Age of Onset   Asthma Other        breast cancer   Ovarian cancer Neg Hx     Social History   Socioeconomic History   Marital status: Single    Spouse name: Not on file   Number of children: Not on file   Years of education: Not on file   Highest education level: Some college, no degree  Occupational History   Not on file  Tobacco Use   Smoking status: Never   Smokeless tobacco: Never  Vaping Use   Vaping status: Never Used  Substance and Sexual Activity   Alcohol use: Yes    Comment: socially   Drug use: Never   Sexual activity: Yes    Partners: Male    Birth control/protection: Condom  Other Topics Concern   Not on file  Social History Narrative   Working in [employer]. Lives with 62 year old son.    Parents like near by.    Social Drivers of Corporate Investment Banker Strain: Low Risk  (03/27/2024)   Overall Financial Resource Strain (CARDIA)    Difficulty of Paying Living Expenses: Not very hard  Food Insecurity: No Food Insecurity (03/27/2024)   Hunger Vital Sign    Worried About Running Out  of Food in the Last Year: Never true    Ran Out of Food in the Last Year: Never true  Transportation Needs: No Transportation Needs (03/27/2024)   PRAPARE - Administrator, Civil Service (Medical): No    Lack of Transportation (Non-Medical): No  Physical Activity: Sufficiently Active (03/27/2024)   Exercise Vital Sign    Days of Exercise per Week: 5 days    Minutes of Exercise per Session: 50 min  Stress: Stress Concern Present (03/27/2024)   [employer] of Occupational Health - Occupational Stress Questionnaire    Feeling of Stress: To some extent  Social Connections: Socially Isolated (03/27/2024)   Social Connection and Isolation Panel    Frequency of Communication with Friends and Family: Twice a week    Frequency of Social Gatherings with Friends and Family: Twice a week    Attends Religious Services: Never    Database Administrator or Organizations: No    Attends Engineer, Structural: Not on file    Marital Status: Never married  Intimate Partner Violence: Not on file     Outpatient Medications Prior to Visit  Medication Sig Dispense Refill   albuterol  (PROVENTIL  HFA;VENTOLIN  HFA) 108 (90 Base)  MCG/ACT inhaler Inhale 2 puffs into the lungs every 6 (six) hours as needed for wheezing or shortness of breath. 1 Inhaler 2   escitalopram  (LEXAPRO ) 5 MG tablet Take 1 tablet (5 mg total) by mouth daily. 30 tablet 1   hydrOXYzine  (ATARAX ) 10 MG tablet Take 1 tablet (10 mg total) by mouth at bedtime. 30 tablet 0   ondansetron  (ZOFRAN ) 4 MG tablet Take 1 tablet (4 mg total) by mouth every 8 (eight) hours as needed for nausea or vomiting. 20 tablet 0   No facility-administered medications prior to visit.    Allergies  Allergen Reactions   Pollen Extract Swelling    Itchy eyes, runny nose Itchy eyes, runny nose     ROS Review of Systems Negative unless indicated in HPI.    Objective:    Physical Exam  BP 112/68   Pulse 83   Temp 98.3 F (36.8  C)   Ht 5' 8 (1.727 m)   Wt 154 lb (69.9 kg)   LMP 04/11/2024   SpO2 99%   BMI 23.42 kg/m  Wt Readings from Last 3 Encounters:  05/20/24 154 lb (69.9 kg)  03/28/24 157 lb 6.4 oz (71.4 kg)  02/15/24 161 lb (73 kg)     Health Maintenance  Topic Date Due   Pneumococcal Vaccine (1 of 2 - PCV) 07/04/2017   HPV VACCINES (3 - 3-dose series) 10/10/2023   COVID-19 Vaccine (1 - 2025-26 season) Never done   Influenza Vaccine  09/09/2024 (Originally 01/11/2024)   Cervical Cancer Screening (Pap smear)  05/15/2026   DTaP/Tdap/Td (9 - Td or Tdap) 10/05/2030   Hepatitis C Screening  Completed   HIV Screening  Completed   Meningococcal B Vaccine  Aged Out   Hepatitis B Vaccines 19-59 Average Risk  Discontinued       Topic Date Due   HPV VACCINES (3 - 3-dose series) 10/10/2023    Lab Results  Component Value Date   TSH 2.01 12/28/2023   Lab Results  Component Value Date   WBC 5.6 12/28/2023   HGB 12.7 12/28/2023   HCT 38.0 12/28/2023   MCV 88.4 12/28/2023   PLT 241.0 12/28/2023   Lab Results  Component Value Date   NA 139 12/28/2023   K 3.8 12/28/2023   CO2 28 12/28/2023   GLUCOSE 66 (L) 12/28/2023   BUN 12 12/28/2023   CREATININE 0.78 12/28/2023   BILITOT 0.7 12/28/2023   ALKPHOS 51 12/28/2023   AST 19 12/28/2023   ALT 18 12/28/2023   PROT 7.0 12/28/2023   ALBUMIN 4.3 12/28/2023   CALCIUM 9.2 12/28/2023   ANIONGAP 10 02/11/2023   GFR 105.52 12/28/2023   No results found for: CHOL No results found for: HDL No results found for: LDLCALC No results found for: TRIG No results found for: CHOLHDL No results found for: YHAJ8R    Assessment & Plan:   Assessment & Plan Elevated serum hCG     Missed period  Orders:   hCG, serum, qualitative   Assessment and Plan Assessment & Plan       Follow-up: No follow-ups on file.   Lanah Steines, NP

## 2024-05-21 LAB — HCG, SERUM, QUALITATIVE: Preg, Serum: NEGATIVE

## 2024-05-22 ENCOUNTER — Ambulatory Visit: Payer: Self-pay | Admitting: Nurse Practitioner

## 2024-05-22 ENCOUNTER — Ambulatory Visit: Admitting: Obstetrics and Gynecology

## 2024-05-22 ENCOUNTER — Encounter: Payer: Self-pay | Admitting: Obstetrics and Gynecology

## 2024-05-22 VITALS — BP 96/65 | HR 65 | Ht 69.0 in | Wt 154.0 lb

## 2024-05-22 DIAGNOSIS — Z01419 Encounter for gynecological examination (general) (routine) without abnormal findings: Secondary | ICD-10-CM

## 2024-05-22 DIAGNOSIS — R1032 Left lower quadrant pain: Secondary | ICD-10-CM | POA: Diagnosis not present

## 2024-05-22 DIAGNOSIS — Z3041 Encounter for surveillance of contraceptive pills: Secondary | ICD-10-CM

## 2024-05-22 DIAGNOSIS — N926 Irregular menstruation, unspecified: Secondary | ICD-10-CM

## 2024-05-22 DIAGNOSIS — Z01411 Encounter for gynecological examination (general) (routine) with abnormal findings: Secondary | ICD-10-CM | POA: Diagnosis not present

## 2024-05-22 DIAGNOSIS — Z113 Encounter for screening for infections with a predominantly sexual mode of transmission: Secondary | ICD-10-CM

## 2024-05-22 MED ORDER — JUNEL FE 1/20 1-20 MG-MCG PO TABS: 1.0000 | ORAL_TABLET | Freq: Every day | ORAL | 3 refills | Status: AC

## 2024-05-22 NOTE — Patient Instructions (Signed)
 I value your feedback and you entrusting [place] with your care. If you get a [place] patient survey, I would appreciate you taking the time to let us know about your experience today. Thank you! ? ? ?

## 2024-06-17 ENCOUNTER — Encounter: Payer: Self-pay | Admitting: Obstetrics and Gynecology

## 2024-06-17 ENCOUNTER — Ambulatory Visit

## 2024-06-17 DIAGNOSIS — R1032 Left lower quadrant pain: Secondary | ICD-10-CM

## 2024-07-11 ENCOUNTER — Encounter: Admitting: Nurse Practitioner

## 2024-08-14 ENCOUNTER — Ambulatory Visit: Admitting: Nurse Practitioner
